# Patient Record
Sex: Male | Born: 1985 | Race: White | Hispanic: No | Marital: Single | Smoking: Current every day smoker
Health system: Southern US, Community
[De-identification: ages and names within clinical notes are randomized; demographics above are authoritative.]

## PROBLEM LIST (undated history)

## (undated) DIAGNOSIS — F319 Bipolar disorder, unspecified: Secondary | ICD-10-CM

---

## 2006-08-09 ENCOUNTER — Emergency Department (HOSPITAL_COMMUNITY): Admission: EM | Admit: 2006-08-09 | Discharge: 2006-08-09 | Payer: Self-pay | Admitting: Emergency Medicine

## 2013-01-01 ENCOUNTER — Encounter (HOSPITAL_COMMUNITY): Payer: Self-pay

## 2013-01-01 ENCOUNTER — Emergency Department (INDEPENDENT_AMBULATORY_CARE_PROVIDER_SITE_OTHER)
Admission: EM | Admit: 2013-01-01 | Discharge: 2013-01-01 | Disposition: A | Discharge status: Home / Self Care | Payer: Self-pay | Source: Home / Self Care

## 2013-01-01 DIAGNOSIS — Z23 Encounter for immunization: Secondary | ICD-10-CM

## 2013-01-01 DIAGNOSIS — S61209A Unspecified open wound of unspecified finger without damage to nail, initial encounter: Secondary | ICD-10-CM

## 2013-01-01 DIAGNOSIS — S61218A Laceration without foreign body of other finger without damage to nail, initial encounter: Secondary | ICD-10-CM

## 2013-01-01 MED ORDER — TETANUS-DIPHTH-ACELL PERTUSSIS 5-2.5-18.5 LF-MCG/0.5 IM SUSP
0.5000 mL | Freq: Once | INTRAMUSCULAR | Status: AC
  Administered 2013-01-01: 0.5 mL via INTRAMUSCULAR

## 2013-01-01 MED ORDER — BACITRACIN 500 UNIT/GM EX OINT
1.0000 "application " | TOPICAL_OINTMENT | Freq: Once | CUTANEOUS | Status: AC
  Administered 2013-01-01: 1 via TOPICAL

## 2013-01-01 MED ORDER — TETANUS-DIPHTH-ACELL PERTUSSIS 5-2.5-18.5 LF-MCG/0.5 IM SUSP
INTRAMUSCULAR | Status: AC
  Filled 2013-01-01: qty 0.5

## 2013-01-01 NOTE — ED Notes (Signed)
Patient instructed to return in 10 days for suture removal , sooner if problems

## 2013-01-01 NOTE — ED Provider Notes (Signed)
CSN: [id number]     Arrival date & time 01/01/13  1214 History   First MD Initiated Contact with Patient 01/01/13 1248     Chief Complaint  Patient presents with  . Finger Injury   (Consider location/radiation/quality/duration/timing/severity/associated sxs/prior Treatment) HPI Comments: Patient was repairing outdoor equipment, air conditioner and as he was removing his finger it brushed against a sharp corner of a metal edge and produced a laceration to the extensor surface of the right index finger.  Bleeding is minimal. Flexion and extension against resistance is intact.   History reviewed. No pertinent past medical history. History reviewed. No pertinent past surgical history. History reviewed. No pertinent family history. History  Substance Use Topics  . Smoking status: Current Every Day Smoker  . Smokeless tobacco: Not on file  . Alcohol Use: No    Review of Systems  Constitutional: Negative.   HENT: Negative.   Respiratory: Negative.   Gastrointestinal: Negative.   Musculoskeletal: Negative for arthralgias.  Skin: Positive for wound.  Neurological: Negative.     Allergies  Review of patient's allergies indicates not on file.  Home Medications  No current outpatient prescriptions on file. BP 98/63  Pulse 86  Temp(Src) 98.2 F (36.8 C) (Oral)  Resp 10  SpO2 100% Physical Exam  Nursing note and vitals reviewed. Constitutional: He is oriented to person, place, and time. He appears well-developed and well-nourished.  Pulmonary/Chest: Effort normal. No respiratory distress.  Musculoskeletal:  Right  index finger with intact flexion and extension against resistance. Distal N/V and M/S intact.  Neurological: He is alert and oriented to person, place, and time. He exhibits normal muscle tone.  Skin: Skin is warm and dry.  4.5 centimeter laceration is superficial running the longitudinal axis over the PIP to the DIP joint. There is a superficial fascial layer with a  .75 cm separation just beneath the subcutaneous layer. No evidence of involvement to the joint capsule. No tendon or bony exposure.  Psychiatric: He has a normal mood and affect.    ED Course  LACERATION REPAIR Date/Time: 01/01/2013 2:00 PM Performed by: Phineas Real, Dawayne Ohair Authorized by: Bradd Canary D Consent: Verbal consent obtained. Risks and benefits: risks, benefits and alternatives were discussed Consent given by: patient Patient understanding: patient states understanding of the procedure being performed Patient identity confirmed: verbally with patient Body area: upper extremity Location details: right index finger Laceration length: 4.5 cm Tendon involvement: none Nerve involvement: none Vascular damage: no Anesthesia: nerve block Local anesthetic: bupivacaine 0.5% without epinephrine Anesthetic total: 8 ml Patient sedated: no Preparation: Patient was prepped and draped in the usual sterile fashion. Irrigation solution: saline Irrigation method: jet lavage Amount of cleaning: extensive Debridement: none Degree of undermining: none Skin closure: 4-0 Prolene Subcutaneous closure: 4-0 Chromic gut Number of sutures: 9 Technique: simple Approximation: close Approximation difficulty: complex Dressing: antibiotic ointment and 4x4 sterile gauze Comments: The wound was explored, no tendon, joint, nerve, vessel involvement. Scrubbed with 4 x 4 and copiously irrigated.  Post closure the finger function was reassessed and is normal, no limitations or weakness. N/V, M/S remains intact.   (including critical care time) Labs Review Labs Reviewed - No data to display Imaging Review No results found.  MDM   1. Laceration of index finger, initial encounter      Keep the finger wrapped for the next 24 hours then change the dressing daily for 2-3 days. May use Neosporin ointment for the first 2-3 days, after that do not use additional ointment.  Keep clean and dry. Watch for signs of  infection. If any signs of infection return immediately. Otherwise return in 10 days for suture removal.  Hayden Rasmussen, NP 01/01/13 1438  Hayden Rasmussen, NP 01/01/13 1739

## 2013-01-01 NOTE — ED Notes (Signed)
Injury to right index finger w sharp piece of metal earlier today . Aprox 4 cm laceration observed, minimal bleeding . Pt states he could see the bone at time of injury; denies other injury

## 2013-01-02 NOTE — ED Provider Notes (Signed)
Medical screening examination/treatment/procedure(s) were performed by resident physician or non-physician practitioner and as supervising physician I was immediately available for consultation/collaboration.   Johana Hopkinson DOUGLAS MD.   Karrina Lye D Arleta Ostrum, MD 01/02/13 1550 

## 2015-04-30 ENCOUNTER — Encounter (HOSPITAL_COMMUNITY): Payer: Self-pay

## 2015-04-30 ENCOUNTER — Emergency Department (HOSPITAL_COMMUNITY)
Admission: EM | Admit: 2015-04-30 | Discharge: 2015-04-30 | Disposition: A | Discharge status: Home / Self Care | Payer: Self-pay | Attending: Emergency Medicine | Admitting: Emergency Medicine

## 2015-04-30 DIAGNOSIS — H538 Other visual disturbances: Secondary | ICD-10-CM | POA: Insufficient documentation

## 2015-04-30 DIAGNOSIS — Z77098 Contact with and (suspected) exposure to other hazardous, chiefly nonmedicinal, chemicals: Secondary | ICD-10-CM | POA: Insufficient documentation

## 2015-04-30 DIAGNOSIS — H5713 Ocular pain, bilateral: Secondary | ICD-10-CM | POA: Insufficient documentation

## 2015-04-30 DIAGNOSIS — H53149 Visual discomfort, unspecified: Secondary | ICD-10-CM | POA: Insufficient documentation

## 2015-04-30 DIAGNOSIS — F17200 Nicotine dependence, unspecified, uncomplicated: Secondary | ICD-10-CM | POA: Insufficient documentation

## 2015-04-30 MED ORDER — TETRACAINE HCL 0.5 % OP SOLN
2.0000 [drp] | Freq: Once | OPHTHALMIC | Status: AC
  Administered 2015-04-30: 2 [drp] via OPHTHALMIC
  Filled 2015-04-30: qty 2

## 2015-04-30 MED ORDER — FLUORESCEIN SODIUM 1 MG OP STRP
1.0000 | ORAL_STRIP | Freq: Once | OPHTHALMIC | Status: AC
  Administered 2015-04-30: 1 via OPHTHALMIC
  Filled 2015-04-30: qty 1

## 2015-04-30 MED ORDER — LORAZEPAM 1 MG PO TABS
1.0000 mg | ORAL_TABLET | Freq: Once | ORAL | Status: AC
  Administered 2015-04-30: 1 mg via ORAL
  Filled 2015-04-30: qty 1

## 2015-04-30 NOTE — ED Notes (Signed)
Morgans lens inserted to both eyes, 1L normal saline infusing to flush eyes

## 2015-04-30 NOTE — ED Notes (Signed)
PER EMS, Pt here with exposure to battery acid to bilateral eyes. EMS has been flushing eyes. Contact stuck in right eye

## 2015-04-30 NOTE — ED Notes (Signed)
Rolm Gala, PA at bedside at this time.

## 2015-04-30 NOTE — ED Notes (Signed)
PA checked the Ph of pts eyes and it indicated his eye's PH was WDL.

## 2015-04-30 NOTE — ED Notes (Signed)
Patient provided with blue paper scrubs to wear home d/t clothes wet from irrigation of eyes.

## 2015-04-30 NOTE — ED Provider Notes (Signed)
CSN: [id number]     Arrival date & time 04/30/15  1943 History   First MD Initiated Contact with Patient 04/30/15 1944     Chief Complaint  Patient presents with  . Chemical Exposure  . Eye Pain   HPI  Mr. Hutzler is a 30 year old male presenting with exposure to the eyes. 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 He states that he got battery acid into both of his eyes. He reports immediate onset of burning, pain and blurred vision. He got into his bathtub and rinsed his eyes with water. He then called EMS and has received 1 L flushing to the eyes prior to arrival. He believes he has a contact stuck in his right eye. He was able to remove the left contact. EMS attempted to locate the contact but did not visualize it. He reports improving symptoms after 1 L saline flush but his vision remains cloudy. He also endorses photophobia.   History reviewed. No pertinent past medical history. History reviewed. No pertinent past surgical history. No family history on file. Social History  Substance Use Topics  . Smoking status: Current Every Day Smoker  . Smokeless tobacco: None  . Alcohol Use: No    Review of Systems  Eyes: Positive for photophobia, pain, redness and visual disturbance. Negative for discharge and itching.  All other systems reviewed and are negative.     Allergies  Review of patient's allergies indicates not on file.  Home Medications   Prior to Admission medications   Not on File   BP 108/76 mmHg  Pulse 70  Temp(Src) 97.8 F (36.6 C) (Oral)  Resp 20  Ht 6' (1.829 m)  Wt 73.936 kg  BMI 22.10 kg/m2  SpO2 100% Physical Exam  Constitutional: He appears well-developed and well-nourished. No distress.  HENT:  Head: Normocephalic and atraumatic.  No burns to the skin surrounding the eyes.   Eyes: EOM and lids are normal. Pupils are equal, round, and reactive to light. Right eye exhibits no discharge. Left eye exhibits no discharge.  Right conjunctiva is injected. Left conjunctiva is injected. No scleral icterus.  Slit lamp exam:      The right eye shows no corneal abrasion, no corneal ulcer, no hyphema and no fluorescein uptake.       The left eye shows no corneal abrasion, no corneal ulcer, no hyphema and no fluorescein uptake.  Bilaterally injected sclera. Significant amount of tearing. Extraocular movements intact. Pupils are round and reactive to light. No hyphema. pH is 7 bilaterally. No corneal abrasion or ulcer on fluorescein exam.     Visual Acuity  Right Eye Near: R Near: 20/40 Left Eye Near:  L Near: 20/70 Bilateral Near:  20/40   Neck: Normal range of motion.  Cardiovascular: Normal rate and regular rhythm.   Pulmonary/Chest: Effort normal. No respiratory distress.  Musculoskeletal: Normal range of motion.  Neurological: He is alert. Coordination normal.  Skin: Skin is warm and dry.  Psychiatric: He has a normal mood and affect. His behavior is normal.  Nursing note and vitals reviewed.   ED Course  Procedures (including critical care time) Labs Review Labs Reviewed - No data to display  Imaging Review No results found. I have personally reviewed and evaluated these images and lab results as part of my medical decision-making.   EKG Interpretation None      8:00 - Spoke with Almira Coaster at [employer] who recommends continued irrigation and  fluorescein exam to evaluate for burns. No other treatment needed.   MDM   Final diagnoses:  Chemical exposure of eye   30 year old male presenting with chemical exposure to bilateral eyes. He reports working on a battery when the battery acid splashed into his eyes. VSS. Pt appears in distress. Upon presentation, pt is receiving eye irrigation by 1 L saline. Placed Morgans lens bilaterally and pt received additional liter of irrigation. Visual acuity 20/40 in right and 20/70 in left. PH 7 bilaterally. Consulted Dr. Genia Del with ophthalmology who recommends pt  follow up with him in the morning at his office. He recommends no further treatment. Return precautions given in discharge paperwork and discussed with pt at bedside. Pt stable for discharge     Alveta Heimlich, PA-C 04/30/15 2319  Rolan Bucco, MD 04/30/15 2330

## 2015-04-30 NOTE — ED Notes (Signed)
Dr. Belfi at bedside at this time. 

## 2015-04-30 NOTE — Discharge Instructions (Signed)
Call Dr. Benard Rink office tomorrow morning to schedule a follow up appointment to see him. Use artificial tears for comfort.

## 2016-05-27 ENCOUNTER — Emergency Department (HOSPITAL_COMMUNITY)
Admission: EM | Admit: 2016-05-27 | Discharge: 2016-05-28 | Disposition: A | Discharge status: Other Acute Inpatient Hospital | Payer: Federal, State, Local not specified - Other | Attending: Emergency Medicine | Admitting: Emergency Medicine

## 2016-05-27 ENCOUNTER — Emergency Department (HOSPITAL_COMMUNITY): Payer: Self-pay

## 2016-05-27 ENCOUNTER — Encounter (HOSPITAL_COMMUNITY): Payer: Self-pay

## 2016-05-27 DIAGNOSIS — R45851 Suicidal ideations: Secondary | ICD-10-CM

## 2016-05-27 DIAGNOSIS — F1994 Other psychoactive substance use, unspecified with psychoactive substance-induced mood disorder: Secondary | ICD-10-CM

## 2016-05-27 DIAGNOSIS — F0630 Mood disorder due to known physiological condition, unspecified: Secondary | ICD-10-CM

## 2016-05-27 DIAGNOSIS — S6990XA Unspecified injury of unspecified wrist, hand and finger(s), initial encounter: Secondary | ICD-10-CM

## 2016-05-27 DIAGNOSIS — Z5181 Encounter for therapeutic drug level monitoring: Secondary | ICD-10-CM | POA: Insufficient documentation

## 2016-05-27 DIAGNOSIS — F10920 Alcohol use, unspecified with intoxication, uncomplicated: Secondary | ICD-10-CM

## 2016-05-27 DIAGNOSIS — S61512A Laceration without foreign body of left wrist, initial encounter: Secondary | ICD-10-CM | POA: Insufficient documentation

## 2016-05-27 DIAGNOSIS — X789XXA Intentional self-harm by unspecified sharp object, initial encounter: Secondary | ICD-10-CM | POA: Insufficient documentation

## 2016-05-27 DIAGNOSIS — Y939 Activity, unspecified: Secondary | ICD-10-CM | POA: Insufficient documentation

## 2016-05-27 DIAGNOSIS — Y92009 Unspecified place in unspecified non-institutional (private) residence as the place of occurrence of the external cause: Secondary | ICD-10-CM | POA: Insufficient documentation

## 2016-05-27 DIAGNOSIS — Y999 Unspecified external cause status: Secondary | ICD-10-CM | POA: Insufficient documentation

## 2016-05-27 DIAGNOSIS — F17200 Nicotine dependence, unspecified, uncomplicated: Secondary | ICD-10-CM | POA: Insufficient documentation

## 2016-05-27 DIAGNOSIS — F10120 Alcohol abuse with intoxication, uncomplicated: Secondary | ICD-10-CM | POA: Insufficient documentation

## 2016-05-27 HISTORY — DX: Bipolar disorder, unspecified: F31.9

## 2016-05-27 LAB — COMPREHENSIVE METABOLIC PANEL
ALT: 21 U/L (ref 17–63)
AST: 40 U/L (ref 15–41)
Albumin: 4.5 g/dL (ref 3.5–5.0)
Alkaline Phosphatase: 66 U/L (ref 38–126)
Anion gap: 11 (ref 5–15)
BUN: 9 mg/dL (ref 6–20)
CO2: 22 mmol/L (ref 22–32)
Calcium: 9.1 mg/dL (ref 8.9–10.3)
Chloride: 109 mmol/L (ref 101–111)
Creatinine, Ser: 0.9 mg/dL (ref 0.61–1.24)
GFR calc Af Amer: >60 mL/min (ref >60)
Glucose, Bld: 84 mg/dL (ref 65–99)
POTASSIUM: 4 mmol/L (ref 3.5–5.1)
Sodium: 142 mmol/L (ref 135–145)
TOTAL PROTEIN: 7.5 g/dL (ref 6.5–8.1)
Total Bilirubin: 0.7 mg/dL (ref 0.3–1.2)

## 2016-05-27 LAB — RAPID URINE DRUG SCREEN, HOSP PERFORMED
AMPHETAMINES: POSITIVE — AB
BENZODIAZEPINES: NOT DETECTED
Barbiturates: NOT DETECTED
Cocaine: NOT DETECTED
OPIATES: POSITIVE — AB
Tetrahydrocannabinol: POSITIVE — AB

## 2016-05-27 LAB — CBC
HCT: 40 % (ref 39.0–52.0)
Hemoglobin: 13.6 g/dL (ref 13.0–17.0)
MCH: 27.9 pg (ref 26.0–34.0)
MCHC: 34 g/dL (ref 30.0–36.0)
MCV: 82.1 fL (ref 78.0–100.0)
Platelets: 327 10*3/uL (ref 150–400)
RBC: 4.87 MIL/uL (ref 4.22–5.81)
RDW: 13.8 % (ref 11.5–15.5)
WBC: 13.1 10*3/uL — AB (ref 4.0–10.5)

## 2016-05-27 LAB — ACETAMINOPHEN LEVEL: Acetaminophen (Tylenol), Serum: <10 ug/mL — ABNORMAL LOW (ref 10–30)

## 2016-05-27 LAB — SALICYLATE LEVEL

## 2016-05-27 LAB — ETHANOL: ALCOHOL ETHYL (B): 159 mg/dL — AB (ref <5)

## 2016-05-27 MED ORDER — NICOTINE 14 MG/24HR TD PT24: 14.0000 mg | MEDICATED_PATCH | Freq: Every day | TRANSDERMAL | Status: DC

## 2016-05-27 NOTE — ED Triage Notes (Signed)
PT arrives via EMS from home with lacerations to LEFT wrist. Bleeding controlled by gauze. Pt family on scene reported to ems that pt has been abusing cocaine, adderall, and alcohol x 4 days. Pt admits to use of heroine today to EMS. Pt reports to me that he has used "all drugs". Pt denied SI but states he does not know how the cuts on his left wrist got there. PT agitated during triage and stating hes ready to go. He does not know why he is here. PT refuses to change into burgundy scrubs.

## 2016-05-27 NOTE — ED Notes (Addendum)
Pt changed into paper scrubs. Pt wanded by security. Belongings placed in Pod A nurse's station but not inventoried at the moment. ED tech at bedside for sitting.

## 2016-05-27 NOTE — ED Notes (Signed)
Pt stating he wants to leave and refusing to put on scrubs. Attempted to call pt family to get more information with no answer. Dr. Julieanne Mansonegler in triage to see pt. IVC papers completed. Pt wound to left wrist cleaned and redressed with gauze and coban. Pt has 4 lacerations to left wrist that appear to be self inflicted. 3 superficial and 1 slightly deeper. Pt states they must be from punching glass door. No lacerations to hand.

## 2016-05-27 NOTE — ED Provider Notes (Signed)
[facility]-EMERGENCY DEPT Provider Note   CSN: [id number] Arrival date & time: 05/27/16  2059     History   Chief Complaint Chief Complaint  Patient presents with  . Suicidal    HPI Brad Saunders is a 31 y.o. male    The history is provided by the patient and the EMS personnel. The history is limited by the condition of the patient.  Mental Health Problem  Presenting symptoms: suicidal thoughts (by EMS report from family)   Presenting symptoms: no hallucinations and no homicidal ideas   Degree of incapacity (severity):  Moderate Onset quality:  Unable to specify Context: alcohol use and drug abuse   Associated symptoms: no abdominal pain and no chest pain   Risk factors: no hx of mental illness (per pt denial) and no hx of suicide attempts    Level V caveat for intoxication and patient not willing to answer questions.    History reviewed. No pertinent past medical history.  There are no active problems to display for this patient.   History reviewed. No pertinent surgical history.     Home Medications    Prior to Admission medications   Not on File    Family History No family history on file.  Social History Social History  Substance Use Topics  . Smoking status: Current Every Day Smoker  . Smokeless tobacco: Never Used  . Alcohol use No     Allergies   Patient has no known allergies.   Review of Systems Review of Systems  Unable to perform ROS: Psychiatric disorder  Cardiovascular: Negative for chest pain.  Gastrointestinal: Negative for abdominal pain.  Psychiatric/Behavioral: Positive for suicidal ideas (by EMS report from family). Negative for hallucinations and homicidal ideas.     Physical Exam Updated Vital Signs BP 111/73 (BP Location: Right Arm)   Pulse 70   Temp 98 F (36.7 C) (Oral)   Resp 22   SpO2 100%   Physical Exam  Constitutional: He is oriented to person, place, and time. He appears well-developed and well-nourished.  No distress.  HENT:  Head: Normocephalic and atraumatic.  Mouth/Throat: Oropharynx is clear and moist. No oropharyngeal exudate.  Eyes: Conjunctivae and EOM are normal. Pupils are equal, round, and reactive to light.  Neck: Normal range of motion. Neck supple.  Cardiovascular: Normal rate and intact distal pulses.   No murmur heard. Pulmonary/Chest: No stridor. No respiratory distress.  Abdominal: Soft. There is no tenderness. There is no rebound and no guarding.  Musculoskeletal: He exhibits no edema or tenderness.  Neurological: He is alert and oriented to person, place, and time. He displays normal reflexes. No cranial nerve deficit. He exhibits normal muscle tone. Coordination normal.  Skin: Skin is warm. Capillary refill takes less than 2 seconds. No rash noted. He is not diaphoretic. No erythema.  Psychiatric: He is agitated. He expresses no homicidal and no suicidal ideation. He expresses no suicidal plans and no homicidal plans.  Patient not answering questions for this examiner. Patient does appear agitated at times of discussion. Patient denying SI and HI but clearly tried to cut his wrist tonight and told his family he wanted to kill himself by EMS report.     ED Treatments / Results  Labs (all labs ordered are listed, but only abnormal results are displayed) Labs Reviewed  ETHANOL - Abnormal; Notable for the following:       Result Value   Alcohol, Ethyl (B) 159 (*)    All other components within normal  limits  ACETAMINOPHEN LEVEL - Abnormal; Notable for the following:    Acetaminophen (Tylenol), Serum <10 (*)    All other components within normal limits  CBC - Abnormal; Notable for the following:    WBC 13.1 (*)    All other components within normal limits  RAPID URINE DRUG SCREEN, HOSP PERFORMED - Abnormal; Notable for the following:    Opiates POSITIVE (*)    Amphetamines POSITIVE (*)    Tetrahydrocannabinol POSITIVE (*)    All other components within normal limits    COMPREHENSIVE METABOLIC PANEL  SALICYLATE LEVEL    EKG  EKG Interpretation  Date/Time:  Monday May 27 2016 22:22:06 EST Ventricular Rate:  74 PR Interval:    QRS Duration: 82 QT Interval:  403 QTC Calculation: 448 R Axis:   85 Text Interpretation:  Sinus rhythm Short PR interval Abnrm T, consider ischemia, anterolateral lds No old tracing to compare Confirmed by Rhunette CroftNANAVATI, MD, Janey GentaANKIT [phone]) on 05/28/2016 4:23:35 AM Also confirmed by Rhunette CroftNANAVATI, MD, Janey GentaANKIT [phone]([id number]), editor Stout CT, Jola BabinskiMarilyn [phone]([id number])  on 05/28/2016 7:56:35 AM       Radiology Dg Wrist Complete Left  Result Date: 05/27/2016 CLINICAL DATA:  Acute onset of lacerations to the left wrist, with bruising and abrasions about the hands. Initial encounter. EXAM: LEFT WRIST - COMPLETE 3+ VIEW COMPARISON:  None. FINDINGS: There is no evidence of fracture or dislocation. The carpal rows are intact, and demonstrate normal alignment. The joint spaces are preserved. Known soft tissue lacerations are not well characterized on radiograph. No radiopaque foreign bodies are seen. IMPRESSION: No evidence of fracture or dislocation. Electronically Signed   By: Roanna RaiderJeffery  Chang M.D.   On: 05/27/2016 22:41   Dg Hand Complete Left  Result Date: 05/27/2016 CLINICAL DATA:  Lacerations to the left wrist, acute onset, with bruising and abrasions about the hand. Initial encounter. EXAM: LEFT HAND - COMPLETE 3+ VIEW COMPARISON:  None. FINDINGS: There is no evidence of fracture or dislocation. The joint spaces are preserved. The carpal rows are intact, and demonstrate normal alignment. The soft tissues are unremarkable in appearance. The known soft tissue lacerations are not well characterized. No radiopaque foreign bodies are seen. IMPRESSION: 1. No evidence of fracture or dislocation. 2. No radiopaque foreign bodies seen. Electronically Signed   By: Roanna RaiderJeffery  Chang M.D.   On: 05/27/2016 22:40   Dg Hand Complete Right  Result Date: 05/27/2016 CLINICAL DATA:   Acute onset of right hand bruising and abrasions. Initial encounter. EXAM: RIGHT HAND - COMPLETE 3+ VIEW COMPARISON:  None. FINDINGS: There is no evidence of fracture or dislocation. There is mild chronic deformity of the fifth metacarpal. The joint spaces are preserved. The carpal rows are intact, and demonstrate normal alignment. Known soft tissue abrasions are not well characterized on radiograph. IMPRESSION: No evidence of fracture or dislocation. Electronically Signed   By: Roanna RaiderJeffery  Chang M.D.   On: 05/27/2016 22:42    Procedures Procedures (including critical care time)  Medications Ordered in ED Medications  nicotine (NICODERM CQ - dosed in mg/24 hours) patch 14 mg (14 mg Transdermal Refused 05/28/16 1005)     Initial Impression / Assessment and Plan / ED Course  I have reviewed the triage vital signs and the nursing notes.  Pertinent labs & imaging results that were available during my care of the patient were reviewed by me and considered in my medical decision making (see chart for details).     9:25 PM Nursing called to request examination of patient  in triage.  According to nursing description of EMS report, EMS was called by family for concerns for suicidal ideation and self-injurious behavior with lacerations to left wrist. They report the patient has been abusing alcohol, cocaine, heroin, and Adderall for the last 4 days. They really felt that the patient was a danger to himself thus calling EMS.   I quickly examined the patient and he denies SI or HI however, he does appear clinically intoxicated. Patient has lacerations to the left wrist that are hemostatic on my examination. Patient also has abrasions on both hands. Initially, patient reported that he does not know how he injured himself however, when the wounds were being explored, he reports they were from a glass door.   Due to the inconsistencies of his story and the reported family concern for suicidal ideation,  substance abuse, and the self injuries behavior, patient will be placed under involuntary commitment to allow further workup and management. Feel patient is a danger to himself at this time. Emergency contact in chart was the patient's mother and did not answer the phone.    Patient was brought back to his exam room after involuntary commitment. Patient continues to deny SI and HI and will not answer questions. He says he does not know what happened to him tonight. He does report drug use. And alcohol use.  Patient will have x-rays of his hands to look for retained foreign bodies and fractures.   Patient will have screening laboratory testing and will subsequently have psychiatry consultation with TTS.  11:18 PM patient was again assessed and will not answer questions. Patient is staring at the floor and will not engage this examiner in meaningful conversation.  Await medical clearance following lab testing for TTS evaluation.  Patient does however say that he doesn't want stitches in his arm. Patient's arm was evaluated in his lacerations were superficial and hemostatic. Patient offered dressing with Steri-Strips however, patient wants to leave it bandaged. Patient reports that he is up-to-date on his tetanus vaccination.  Patient laboratory testing shows intoxication. Patient will metabolize and then pt will be seen by TTS for further management Recommendations.    Final Clinical Impressions(s) / ED Diagnoses   Final diagnoses:  Hand injury  Suicidal ideation  Alcoholic intoxication without complication (HCC)    Clinical Impression: 1. Suicidal ideation   2. Hand injury   3. Alcoholic intoxication without complication (HCC)     Disposition: Awaiting behavioral health assessment, patient currently under involuntary commitment  Condition: Stable     Heide Scales, MD 05/28/16 1120

## 2016-05-27 NOTE — ED Notes (Signed)
[employer] and security at bedside. Blood collected and EKG completed. Pt non compliant at first but stated "just do what you need to do so you can get out of here." Pt reporting 10/10 pain on left wrist. Pt bleeding through bandages. Redressed and cleaned.

## 2016-05-27 NOTE — ED Notes (Addendum)
Patient transported to X-ray with security.

## 2016-05-28 ENCOUNTER — Inpatient Hospital Stay (HOSPITAL_COMMUNITY)
Admission: AD | Admit: 2016-05-28 | Discharge: 2016-06-02 | DRG: 897 | Disposition: A | Discharge status: Home / Self Care | Payer: Federal, State, Local not specified - Other | Attending: Psychiatry | Admitting: Psychiatry

## 2016-05-28 ENCOUNTER — Encounter (HOSPITAL_COMMUNITY): Payer: Self-pay | Admitting: *Deleted

## 2016-05-28 DIAGNOSIS — F0630 Mood disorder due to known physiological condition, unspecified: Secondary | ICD-10-CM | POA: Diagnosis present

## 2016-05-28 DIAGNOSIS — G4700 Insomnia, unspecified: Secondary | ICD-10-CM | POA: Diagnosis present

## 2016-05-28 DIAGNOSIS — F11222 Opioid dependence with intoxication with perceptual disturbance: Secondary | ICD-10-CM | POA: Diagnosis not present

## 2016-05-28 DIAGNOSIS — F1994 Other psychoactive substance use, unspecified with psychoactive substance-induced mood disorder: Secondary | ICD-10-CM

## 2016-05-28 DIAGNOSIS — F15129 Other stimulant abuse with intoxication, unspecified: Secondary | ICD-10-CM | POA: Diagnosis present

## 2016-05-28 DIAGNOSIS — F319 Bipolar disorder, unspecified: Secondary | ICD-10-CM | POA: Diagnosis present

## 2016-05-28 DIAGNOSIS — F17210 Nicotine dependence, cigarettes, uncomplicated: Secondary | ICD-10-CM | POA: Diagnosis present

## 2016-05-28 DIAGNOSIS — F15150 Other stimulant abuse with stimulant-induced psychotic disorder with delusions: Secondary | ICD-10-CM | POA: Diagnosis present

## 2016-05-28 DIAGNOSIS — Z915 Personal history of self-harm: Secondary | ICD-10-CM | POA: Diagnosis not present

## 2016-05-28 DIAGNOSIS — F19950 Other psychoactive substance use, unspecified with psychoactive substance-induced psychotic disorder with delusions: Secondary | ICD-10-CM | POA: Diagnosis not present

## 2016-05-28 DIAGNOSIS — F411 Generalized anxiety disorder: Secondary | ICD-10-CM | POA: Diagnosis present

## 2016-05-28 DIAGNOSIS — F1094 Alcohol use, unspecified with alcohol-induced mood disorder: Secondary | ICD-10-CM | POA: Diagnosis not present

## 2016-05-28 DIAGNOSIS — Z79899 Other long term (current) drug therapy: Secondary | ICD-10-CM | POA: Diagnosis not present

## 2016-05-28 MED ORDER — NICOTINE 21 MG/24HR TD PT24
21.0000 mg | MEDICATED_PATCH | Freq: Every day | TRANSDERMAL | Status: DC
  Administered 2016-05-28: 21 mg via TRANSDERMAL
  Filled 2016-05-28: qty 1

## 2016-05-28 MED ORDER — ACETAMINOPHEN 325 MG PO TABS
650.0000 mg | ORAL_TABLET | ORAL | Status: DC | PRN
Start: 2016-05-28 — End: 2016-05-28

## 2016-05-28 MED ORDER — HYDROXYZINE HCL 25 MG PO TABS
25.0000 mg | ORAL_TABLET | Freq: Three times a day (TID) | ORAL | Status: DC | PRN
  Administered 2016-05-28 – 2016-06-02 (×4): 25 mg via ORAL
  Filled 2016-05-28: qty 10
  Filled 2016-05-28 (×4): qty 1

## 2016-05-28 MED ORDER — MAGNESIUM HYDROXIDE 400 MG/5ML PO SUSP: 30.0000 mL | Freq: Every day | ORAL | Status: DC | PRN

## 2016-05-28 MED ORDER — ONDANSETRON HCL 4 MG PO TABS: 4.0000 mg | ORAL_TABLET | Freq: Three times a day (TID) | ORAL | Status: DC | PRN

## 2016-05-28 MED ORDER — ACETAMINOPHEN 325 MG PO TABS
650.0000 mg | ORAL_TABLET | Freq: Four times a day (QID) | ORAL | Status: DC | PRN
  Administered 2016-05-29: 650 mg via ORAL
  Filled 2016-05-28: qty 2

## 2016-05-28 MED ORDER — ZOLPIDEM TARTRATE 5 MG PO TABS: 5.0000 mg | ORAL_TABLET | Freq: Every evening | ORAL | Status: DC | PRN

## 2016-05-28 MED ORDER — ALUM & MAG HYDROXIDE-SIMETH 200-200-20 MG/5ML PO SUSP: 30.0000 mL | ORAL | Status: DC | PRN

## 2016-05-28 MED ORDER — TRAZODONE HCL 50 MG PO TABS
50.0000 mg | ORAL_TABLET | Freq: Every evening | ORAL | Status: DC | PRN
  Administered 2016-05-28: 50 mg via ORAL
  Filled 2016-05-28: qty 1

## 2016-05-28 MED ORDER — IBUPROFEN 400 MG PO TABS: 600.0000 mg | ORAL_TABLET | Freq: Three times a day (TID) | ORAL | Status: DC | PRN

## 2016-05-28 MED ORDER — LORAZEPAM 1 MG PO TABS
1.0000 mg | ORAL_TABLET | Freq: Three times a day (TID) | ORAL | Status: DC | PRN
  Administered 2016-05-28: 1 mg via ORAL
  Filled 2016-05-28: qty 1

## 2016-05-28 NOTE — ED Notes (Signed)
Spouse visiting w/pt.  

## 2016-05-28 NOTE — Consult Note (Signed)
[name]   Reason for Consult:  SIB, destruction of property and polysubstance abuse vs intoxication Referring Physician:  EDP Patient Identification: Brad Saunders MRN:  [id number] Principal Diagnosis: Psychoactive substance-induced mood disorder ([place]) Diagnosis:  There are no active problems to display for this patient.   Total Time spent with patient: 1 hour  Subjective:   Brad Saunders is a 31 y.o. male patient admitted with polysubstance intoxication and SIB.  HPI:  Brad Saunders is an 31 y.o. male, Caucasian, seen, chart reviewed and case discussed with the staff RN on the unit for this face-to-face psychiatric consultation and evaluation of increased symptoms of depression, relapse and drug abuse, intoxication with alcohol, opiates, amphetamines and tetrahydrocannabinol. Patient reported he has been depressed and unhappy about not having a good relationship with his wife and blames her or the drug of abuse. Patient reported he was relapse of her longtime of sober, took 4 piece of amphetamines, 4 bottles of wine and 6 about a beer for a whole day long before came to home and then he broke the window which resulted in multiple lacerations on his right forearm and then horizontal multiple lacerations on left forearm required dressing with gauze. Patient family reported he has been abusing cocaine, Adderall, alcohol for the last 4 days which is minimized by the patient. Reportedly patient was also abused heroin which patient denied during my evaluation. Patient endorses is self-injurious behavior, property damage and putting himself in danger. Patient reportedly has 4 children ages from one year to 68 years old and reportedly never been involved with Department of Social Services. Patient also denied suicidal attempts in the past. Patient also denied history of detox treatment and rehabilitation treatment in the past. Patient is currently placed on involuntary  commitment and he meets criteria for acute psychiatric hospitalization for both depression and substance abuse. Patient was cooperative throughout the assessment and states that he is agreeable to inpatient psychiatric treatment.  A blood alcohol level is 159 on admission to the: Emergency department and his urine drug screen is positive for amphetamines, tetrahydrocannabinol, and opiates.  Past Psychiatric History: Substance Induced Depressive Disorder; Alcohol Use Disorder, Severe  Risk to Self: Suicidal Ideation: No Suicidal Intent: No Is patient at risk for suicide?: No Suicidal Plan?: No Access to Means: No What has been your use of drugs/alcohol within the last 12 months?: amphetmaines, opiates, alcohol How many times?: 0 Other Self Harm Risks: none Triggers for Past Attempts: Unknown Intentional Self Injurious Behavior: None Risk to Others: Homicidal Ideation: No Thoughts of Harm to Others: No Current Homicidal Intent: No Current Homicidal Plan: No Access to Homicidal Means: No Identified Victim: none History of harm to others?: No Assessment of Violence: None Noted Violent Behavior Description: n/a Does patient have access to weapons?: No Criminal Charges Pending?: No Does patient have a court date: No Prior Inpatient Therapy: Prior Inpatient Therapy: No Prior Therapy Dates: n/a Prior Therapy Facilty/Provider(s): n/a Reason for Treatment: n/a Prior Outpatient Therapy: Prior Outpatient Therapy: No Prior Therapy Dates: n/a Prior Therapy Facilty/Provider(s): n/a Reason for Treatment: n/a Does patient have an ACCT team?: No Does patient have Intensive In-House Services?  : No Does patient have [facility] services? : No Does patient have P4CC services?: No  Past Medical History: History reviewed. No pertinent past medical history. History reviewed. No pertinent surgical history. Family History: No family history on file. Family Psychiatric  History: Patient has been married  and lives with wife and 4 children. Reportedly patient  wife is also been involved with the drug of abuse especially opiates. Social History:  History  Alcohol Use No     History  Drug use: Unknown    Social History   Social History  . Marital status: Single    Spouse name: N/A  . Number of children: N/A  . Years of education: N/A   Social History Main Topics  . Smoking status: Current Every Day Smoker  . Smokeless tobacco: Never Used  . Alcohol use No  . Drug use: Unknown  . Sexual activity: Not Asked   Other Topics Concern  . None   Social History Narrative  . None   Additional Social History:    Allergies:  No Known Allergies  Labs:  Results for orders placed or performed during the hospital encounter of 05/27/16 (from the past 48 hour(s))  Comprehensive metabolic panel     Status: None   Collection Time: 05/27/16 10:10 PM  Result Value Ref Range   Sodium 142 135 - 145 mmol/L   Potassium 4.0 3.5 - 5.1 mmol/L   Chloride 109 101 - 111 mmol/L   CO2 22 22 - 32 mmol/L   Glucose, Bld 84 65 - 99 mg/dL   BUN 9 6 - 20 mg/dL   Creatinine, Ser 0.90 0.61 - 1.24 mg/dL   Calcium 9.1 8.9 - 10.3 mg/dL   Total Protein 7.5 6.5 - 8.1 g/dL   Albumin 4.5 3.5 - 5.0 g/dL   AST 40 15 - 41 U/L   ALT 21 17 - 63 U/L   Alkaline Phosphatase 66 38 - 126 U/L   Total Bilirubin 0.7 0.3 - 1.2 mg/dL   GFR calc non Af Amer >60 >60 mL/min   GFR calc Af Amer >60 >60 mL/min    Comment: (NOTE) The eGFR has been calculated using the CKD EPI equation. This calculation has not been validated in all clinical situations. eGFR's persistently <60 mL/min signify possible Chronic Kidney Disease.    Anion gap 11 5 - 15  Ethanol     Status: Abnormal   Collection Time: 05/27/16 10:10 PM  Result Value Ref Range   Alcohol, Ethyl (B) 159 (H) <5 mg/dL    Comment:        LOWEST DETECTABLE LIMIT FOR SERUM ALCOHOL IS 5 mg/dL FOR MEDICAL PURPOSES ONLY   Salicylate level     Status: None   Collection  Time: 05/27/16 10:10 PM  Result Value Ref Range   Salicylate Lvl <1.3 2.8 - 30.0 mg/dL  Acetaminophen level     Status: Abnormal   Collection Time: 05/27/16 10:10 PM  Result Value Ref Range   Acetaminophen (Tylenol), Serum <10 (L) 10 - 30 ug/mL    Comment:        THERAPEUTIC CONCENTRATIONS VARY SIGNIFICANTLY. A RANGE OF 10-30 ug/mL MAY BE AN EFFECTIVE CONCENTRATION FOR MANY PATIENTS. HOWEVER, SOME ARE BEST TREATED AT CONCENTRATIONS OUTSIDE THIS RANGE. ACETAMINOPHEN CONCENTRATIONS >150 ug/mL AT 4 HOURS AFTER INGESTION AND >50 ug/mL AT 12 HOURS AFTER INGESTION ARE OFTEN ASSOCIATED WITH TOXIC REACTIONS.   cbc     Status: Abnormal   Collection Time: 05/27/16 10:10 PM  Result Value Ref Range   WBC 13.1 (H) 4.0 - 10.5 K/uL   RBC 4.87 4.22 - 5.81 MIL/uL   Hemoglobin 13.6 13.0 - 17.0 g/dL   HCT 40.0 39.0 - 52.0 %   MCV 82.1 78.0 - 100.0 fL   MCH 27.9 26.0 - 34.0 pg   MCHC 34.0 30.0 -  36.0 g/dL   RDW 13.8 11.5 - 15.5 %   Platelets 327 150 - 400 K/uL  Rapid urine drug screen (hospital performed)     Status: Abnormal   Collection Time: 05/27/16 11:21 PM  Result Value Ref Range   Opiates POSITIVE (A) NONE DETECTED   Cocaine NONE DETECTED NONE DETECTED   Benzodiazepines NONE DETECTED NONE DETECTED   Amphetamines POSITIVE (A) NONE DETECTED   Tetrahydrocannabinol POSITIVE (A) NONE DETECTED   Barbiturates NONE DETECTED NONE DETECTED    Comment:        DRUG SCREEN FOR MEDICAL PURPOSES ONLY.  IF CONFIRMATION IS NEEDED FOR ANY PURPOSE, NOTIFY LAB WITHIN 5 DAYS.        LOWEST DETECTABLE LIMITS FOR URINE DRUG SCREEN Drug Class       Cutoff (ng/mL) Amphetamine      1000 Barbiturate      200 Benzodiazepine   867 Tricyclics       619 Opiates          300 Cocaine          300 THC              50     Current Facility-Administered Medications  Medication Dose Route Frequency Provider Last Rate Last Dose  . nicotine (NICODERM CQ - dosed in mg/24 hours) patch 14 mg  14 mg  Transdermal Daily Courtney Paris, MD       No current outpatient prescriptions on file.    Musculoskeletal: Strength & Muscle Tone: within normal limits Gait & Station: normal Patient leans: N/A  Psychiatric Specialty Exam: Physical Exam Full physical performed in Emergency Department. I have reviewed this assessment and concur with its findings.   ROS patient has multiple lacerations on left forearm which are horizontal required dressing to prevent blood loss and also has multiple superficial lacerations/injuries in right hand. Patient denied nausea, vomiting, sweating, tremors, shakes, withdrawal seizures and hallucinations, denied chest pain and shortness of breath.  No Fever-chills, No Headache, No changes with Vision or hearing, reports vertigo No problems swallowing food or Liquids, No Chest pain, Cough or Shortness of Breath, No Abdominal pain, No Nausea or Vommitting, Bowel movements are regular, No Blood in stool or Urine, No dysuria, No new skin rashes or bruises, No new joints pains-aches,  No new weakness, tingling, numbness in any extremity, No recent weight gain or loss, No polyuria, polydypsia or polyphagia,  A full 10 point Review of Systems was done, except as stated above, all other Review of Systems were negative.  Blood pressure (!) 110/52, pulse 65, temperature 98.2 F (36.8 C), temperature source Oral, resp. rate 18, SpO2 100 %.There is no height or weight on file to calculate BMI.  General Appearance: Guarded  Eye Contact:  Good  Speech:  Clear and Coherent  Volume:  Decreased  Mood:  Anxious and Depressed  Affect:  Congruent, Constricted and Depressed  Thought Process:  Coherent and Goal Directed  Orientation:  Full (Time, Place, and Person)  Thought Content:  Rumination  Suicidal Thoughts:  Status post self-injurious behaviors while intoxicated.  Homicidal Thoughts:  No  Memory:  Immediate;   Good Recent;   Fair  Judgement:  Impaired   Insight:  Fair  Psychomotor Activity:  Decreased  Concentration:  Concentration: Fair and Attention Span: Fair  Recall:  [employer] of Knowledge:  Good  Language:  Good  Akathisia:  Negative  Handed:  Right  AIMS (if indicated):  Assets:  Communication Skills Desire for Improvement Financial Resources/Insurance Housing Leisure Time Physical Health Resilience Social Support Talents/Skills Transportation  ADL's:  Intact  Cognition:  WNL  Sleep:        Treatment Plan Summary: 31 years old male with history of polysubstance abuse, recent relapse which caused an dangerous and disruptive behaviors like damaging the property and self-injurious behaviors while intoxicated. Patient cannot contract for safety during this evaluation and willing to participate in inpatient treatment program.   Diagnosis:  Psychoactive substance induced mood disorder  Polysubstance abuse versus dependence  Alcohol intoxication and polysubstance intoxication  Marital discord/relationship problems   Patient has been placed on involuntary commitment by emergency department physicians Continue safety sitter  Monitor for the alcohol withdrawal symptoms CIWA protocol and lorazepam detox protocol  Patient meets criteria for acute psychiatric hospitalization when medically stable.   Appreciate psychiatric consultation and we sign off as of today as patient required inpatient treatment Please contact [phone] or [phone] if needs further assistance   Disposition: Recommend psychiatric Inpatient admission when medically cleared. Supportive therapy provided about ongoing stressors.  Ambrose Finland, MD 05/28/2016 11:08 AM

## 2016-05-28 NOTE — ED Notes (Signed)
Dr Oneta RackJonnalaggadda in w/pt.

## 2016-05-28 NOTE — ED Notes (Signed)
Per Dr Oneta RackJonnalaggadda, recommends to keep IVC d/t pt needs inpt tx.

## 2016-05-28 NOTE — ED Notes (Signed)
Patient was given a [place] Sandwich Bag with a Sprite.

## 2016-05-28 NOTE — Tx Team (Signed)
Initial Treatment Plan 05/28/2016 11:33 PM Brad Saunders Bandel [id number]    PATIENT STRESSORS: Financial difficulties Medication change or noncompliance Substance abuse   PATIENT STRENGTHS: Active sense of humor Average or above average intelligence Communication skills General fund of knowledge Supportive family/friends   PATIENT IDENTIFIED PROBLEMS: "felt like I didn't matter anymore"  "the way I been acting, like I'm crazy"   "been using pain pills (roxicet)"  "drank bottle of wine last night"  SA cut left wrist             DISCHARGE CRITERIA:  Ability to meet basic life and health needs Improved stabilization in mood, thinking, and/or behavior Motivation to continue treatment in a less acute level of care Need for constant or close observation no longer present Reduction of life-threatening or endangering symptoms to within safe limits Verbal commitment to aftercare and medication compliance  PRELIMINARY DISCHARGE PLAN: Attend aftercare/continuing care group Attend PHP/IOP Participate in family therapy Return to previous living arrangement  PATIENT/FAMILY INVOLVEMENT: This treatment plan has been presented to and reviewed with the patient, Brad Saunders Callicott, and/or family member.  The patient and family have been given the opportunity to ask questions and make suggestions.  Mickeal NeedyJohnson, Ott Zimmerle N, RN 05/28/2016, 11:33 PM

## 2016-05-28 NOTE — Progress Notes (Signed)
Per Karleen HampshireSpencer, PA recommend a.m. Psych evaluation Angelino Rumery K. Sherlon HandingHarris, LCAS-A, LPC-A, [facility]  Counselor 05/28/2016 2:13 AM

## 2016-05-28 NOTE — ED Notes (Signed)
Patient transported by sheriff's office to [facility].  No distress at this time

## 2016-05-28 NOTE — ED Notes (Signed)
[facility]  [facility] office contacted to transport patient.

## 2016-05-28 NOTE — ED Notes (Addendum)
TTS at bedside. 

## 2016-05-28 NOTE — ED Notes (Signed)
[facility] Lindie SpruceMeghan, [facility] Dr Henrene HawkingJonnalaggada to see pt.

## 2016-05-28 NOTE — ED Notes (Addendum)
Pt ambulatory to F7 w/Sitter and RN - pt wearing burgundy scrubs and has bandage on his left wrist - dry, intact. Pt able to wiggle fingers, move wrist, and cap refill less than 3 seconds. Offered pt lunch tray, po fluids, Nicotine patch, Ativan - pt refused. Pt noted to be irritable - states does not want to be here in ED. Advised pt of tx plan - will be in ED until placement is sought if plan is not changed w/telepsych is performed. Pt noted w/min eye contact. Pt pacing in room and attempting to stand in hallway - looking at Exit doors. Asked pt to stay in room. Pt asking repetitive questions.

## 2016-05-28 NOTE — ED Notes (Signed)
[facility], copy sent to [facility], original placed in folder for Magistrate, and all 3 sets on clipboard. IVC papers were taken out by pt's spouse and EDP.  Pt lying on bed watching tv.

## 2016-05-28 NOTE — ED Notes (Signed)
[facility] Julieanne Cottonina, [facility], [facility] Dr Jama Flavorsobos - call report and transport after 1930 by [employer].

## 2016-05-28 NOTE — BH Assessment (Signed)
Tele Assessment Note   Brad Saunders is an 31 y.o. male, Caucasian who presents to [facility] ED per ED report:  arrives via EMS from home with lacerations to LEFT wrist. Bleeding controlled by gauze. Pt family on scene reported to ems that pt has been abusing cocaine, adderall, and alcohol x 4 days. Pt admits to use of heroine today to EMS. Pt reports to me that he has used "all drugs". Pt denied SI but states he does not know how the cuts on his left wrist got there. PT agitated during triage and stating hes ready to go. He does not know why he is here.  Patient states primary concern is none. Patient states that he drank last night and does not remember much else, and does not know why he is here. Patient states he is married and resides with wife. Patient denies current SI,HI, and AVH. Patient acknowledges hx. Of S.A with alcohol last use 05-27-16 unknown amount, and marijuana, unspecified,and denies others despite Urine drug Screen positive for amphetamines and Opiates as well. Patient denies hx. Of inpatient psych care. Patient is not seen outpatient for psych or S.A. Care. Patient is dressed in scrubs and is alert and oriented x4. Patient speech was within normal limits and motor behavior appeared normal. Patient thought process is coherent. Patient  does not appear to be responding to internal stimuli. Patient was cooperative throughout the assessment and states that he is agreeable to inpatient psychiatric treatment.   Diagnosis: Substance Induced Depressive Disorder; Alcohol Use Disorder, Severe  Past Medical History: History reviewed. No pertinent past medical history.  History reviewed. No pertinent surgical history.  Family History: No family history on file.  Social History:  reports that he has been smoking.  He has never used smokeless tobacco. He reports that he does not drink alcohol. His drug history is not on file.  Additional Social History:  Alcohol / Drug Use Pain  Medications: SEE MAR Prescriptions: SEE MAR Over the Counter: SEE MAR History of alcohol / drug use?: Yes Longest period of sobriety (when/how long): pt does not know Negative Consequences of Use: Financial, Legal, Personal relationships, Work / School Withdrawal Symptoms: Patient aware of relationship between substance abuse and physical/medical complications Substance #1 Name of Substance 1: alcohol 1 - Age of First Use: unspecified 1 - Amount (size/oz): various 1 - Frequency: daily 1 - Duration: years 1 - Last Use / Amount: 05-27-16 unknown amount Substance #2 Name of Substance 2: Opioids/ heroine 2 - Age of First Use: unspecified 2 - Amount (size/oz): unspecified 2 - Frequency: unspecified 2 - Duration: years 2 - Last Use / Amount: unspecified Substance #3 Name of Substance 3: amphetamines 3 - Age of First Use: unspecified 3 - Amount (size/oz): unspecified 3 - Frequency: unspecified 3 - Duration: years 3 - Last Use / Amount: unspecified  CIWA: CIWA-Ar BP: 106/68 Pulse Rate: 66 COWS:    PATIENT STRENGTHS: (choose at least two) Capable of independent living Communication skills General fund of knowledge  Allergies: No Known Allergies  Home Medications:  (Not in a hospital admission)  OB/GYN Status:  No LMP for male patient.  General Assessment Data Location of Assessment: [facility] ED TTS Assessment: In system Is this a Tele or Face-to-Face Assessment?: Tele Assessment Is this an Initial Assessment or a Re-assessment for this encounter?: Initial Assessment Marital status: Married [place] name: n/a Is patient pregnant?: No Pregnancy Status: No Living Arrangements: Spouse/significant other Can pt return to current living arrangement?: Yes  Admission Status: Involuntary Is patient capable of signing voluntary admission?: Yes Referral Source: Other Insurance type: SP     Crisis Care Plan Living Arrangements: Spouse/significant other Name of Psychiatrist:  none Name of Therapist: none  Education Status Is patient currently in school?: No Current Grade: n/a Highest grade of school patient has completed: 9th Name of school: n/a Contact person: n/a  Risk to self with the past 6 months Suicidal Ideation: No Has patient been a risk to self within the past 6 months prior to admission? : No Suicidal Intent: No Has patient had any suicidal intent within the past 6 months prior to admission? : No Is patient at risk for suicide?: No Suicidal Plan?: No Has patient had any suicidal plan within the past 6 months prior to admission? : No Access to Means: No What has been your use of drugs/alcohol within the last 12 months?: amphetmaines, opiates, alcohol Previous Attempts/Gestures: No How many times?: 0 Other Self Harm Risks: none Triggers for Past Attempts: Unknown Intentional Self Injurious Behavior: None Family Suicide History: No Recent stressful life event(s): Turmoil (Comment) Persecutory voices/beliefs?: No Depression: No Substance abuse history and/or treatment for substance abuse?: Yes Suicide prevention information given to non-admitted patients: Not applicable  Risk to Others within the past 6 months Homicidal Ideation: No Does patient have any lifetime risk of violence toward others beyond the six months prior to admission? : No Thoughts of Harm to Others: No Current Homicidal Intent: No Current Homicidal Plan: No Access to Homicidal Means: No Identified Victim: none History of harm to others?: No Assessment of Violence: None Noted Violent Behavior Description: n/a Does patient have access to weapons?: No Criminal Charges Pending?: No Does patient have a court date: No Is patient on probation?: No  Psychosis Hallucinations: None noted Delusions: None noted  Mental Status Report Appearance/Hygiene: In scrubs Eye Contact: Fair Motor Activity: Agitation Speech: Logical/coherent Level of Consciousness: Alert Mood:  Anxious Affect: Anxious Anxiety Level: Moderate Thought Processes: Relevant Judgement: Partial Orientation: Person, Place, Time, Situation, Appropriate for developmental age Obsessive Compulsive Thoughts/Behaviors: None  Cognitive Functioning Concentration: Normal Memory: Recent Intact, Remote Intact IQ: Average Insight: Poor Impulse Control: Poor Appetite: Fair Weight Loss: 0 Weight Gain: 0 Sleep: No Change Total Hours of Sleep: 6 Vegetative Symptoms: None  ADLScreening [facility] Assessment Services) Patient's cognitive ability adequate to safely complete daily activities?: Yes Patient able to express need for assistance with ADLs?: Yes Independently performs ADLs?: Yes (appropriate for developmental age)  Prior Inpatient Therapy Prior Inpatient Therapy: No Prior Therapy Dates: n/a Prior Therapy Facilty/Provider(s): n/a Reason for Treatment: n/a  Prior Outpatient Therapy Prior Outpatient Therapy: No Prior Therapy Dates: n/a Prior Therapy Facilty/Provider(s): n/a Reason for Treatment: n/a Does patient have an ACCT team?: No Does patient have Intensive In-House Services?  : No Does patient have [facility] services? : No Does patient have P4CC services?: No  ADL Screening (condition at time of admission) Patient's cognitive ability adequate to safely complete daily activities?: Yes Is the patient deaf or have difficulty hearing?: No Does the patient have difficulty seeing, even when wearing glasses/contacts?: No Does the patient have difficulty concentrating, remembering, or making decisions?: No Patient able to express need for assistance with ADLs?: Yes Does the patient have difficulty dressing or bathing?: No Independently performs ADLs?: Yes (appropriate for developmental age) Does the patient have difficulty walking or climbing stairs?: No Weakness of Legs: None Weakness of Arms/Hands: None       Abuse/Neglect Assessment (Assessment to be complete  while patient is  alone) Physical Abuse: Denies Verbal Abuse: Denies Sexual Abuse: Denies Exploitation of patient/patient's resources: Denies Self-Neglect: Denies Values / Beliefs Cultural Requests During Hospitalization: None Spiritual Requests During Hospitalization: None   Advance Directives (For Healthcare) Does Patient Have a Medical Advance Directive?: No    Additional Information 1:1 In Past 12 Months?: No CIRT Risk: No Elopement Risk: No Does patient have medical clearance?: Yes     Disposition:  Per Donell SievertSpencer, Simon, PA recommend a.m. Psych evaluation Disposition Initial Assessment Completed for this Encounter: Yes Disposition of Patient: Other dispositions (TBD)  Brittnee Gaetano K Nicoletta Hush 05/28/2016 2:03 AM

## 2016-05-28 NOTE — Progress Notes (Signed)
Patient ID: Brad Saunders, male   DOB: 12-17-1985, 31 y.o.   MRN: [id number] Client is a 31 yo male admitted involuntarily [facility] with lac[facility] to his left wrist. Currently site is wrapped and no drainage noted. Client admits to using Roxicet that he buys off the street, also reports "I drank a bottle of wine last night, I hadn't drank in a long time" Per tele assessment client reported he drank last night and can't remember what else happened or how he got the cuts. Clients UDS was positive for opiates, amphetamines and marijuana. Client is cooperative during this admission, even used some humor, but mostly tense and caution, noted that this was his first inpatient admission. Client contracts for safety. Client is married with three children. Client reports he doesn't know why he's here then reports "I guess cause the way I been acting, like I'm crazy, one moment I be okay and the next I'm not" Admission forms reviewed and client agreeable to treatment. Offered snacks and drink, acclimated to unit. Staff will monitor q2715min for safety. Client is safe on the unit.

## 2016-05-28 NOTE — ED Notes (Signed)
Spouse called - on her way to visit w/pt - advised her pt agreed to see her d/t [facility].

## 2016-05-28 NOTE — ED Notes (Signed)
Pt's spouse called - [phone] - asking if pt had been re-assessed yet. Advised her no. States she wanted to visit pt at 1230 - advised her pt states he does not want to see her or anyone. She requested for RN to keep her updated. Advised her we will as long as pt approves. Offered pt Nicotine and Ativan x 2 - refused.

## 2016-05-28 NOTE — ED Notes (Signed)
Pt's 1 labeled belongings bag placed at nurses' desk for inventory. Pt sitting on side of bed, alert.

## 2016-05-28 NOTE — ED Notes (Addendum)
Pt pacing in room - now asking for Nicotine Patch. Offered pt Ativan - agrees to take. Asked pt if drinks ETOH daily - states no and denies hx DT's/ETOH withdrawal.

## 2016-05-28 NOTE — Progress Notes (Signed)
[facility] after 7:30.  Accepting physician Dr. Jama Flavorsobos.  Call report to [phone].

## 2016-05-28 NOTE — ED Notes (Signed)
Pt aware will be [facility] later this evening. States he is not hungry and has not eaten dinner - ate sandwich prior. Pt lying on bed watching tv.

## 2016-05-28 NOTE — ED Notes (Signed)
Nicotine Patch and Ativan given to pt as requested.

## 2016-05-28 NOTE — ED Notes (Signed)
Pt's spouse aware of tx plan recommendation - inpt tx - advised she will visit at 1730 time.

## 2016-05-29 DIAGNOSIS — F0630 Mood disorder due to known physiological condition, unspecified: Secondary | ICD-10-CM

## 2016-05-29 DIAGNOSIS — Z79899 Other long term (current) drug therapy: Secondary | ICD-10-CM

## 2016-05-29 DIAGNOSIS — F19950 Other psychoactive substance use, unspecified with psychoactive substance-induced psychotic disorder with delusions: Principal | ICD-10-CM

## 2016-05-29 LAB — LIPID PANEL
CHOL/HDL RATIO: 3 ratio
Cholesterol: 117 mg/dL (ref 0–200)
HDL: 39 mg/dL — ABNORMAL LOW (ref >40)
LDL Cholesterol: 57 mg/dL (ref 0–99)
Triglycerides: 103 mg/dL (ref <150)
VLDL: 21 mg/dL (ref 0–40)

## 2016-05-29 LAB — TSH: TSH: 0.546 u[IU]/mL (ref 0.350–4.500)

## 2016-05-29 MED ORDER — NICOTINE 21 MG/24HR TD PT24
21.0000 mg | MEDICATED_PATCH | Freq: Every day | TRANSDERMAL | Status: DC
  Administered 2016-05-29 – 2016-06-02 (×5): 21 mg via TRANSDERMAL
  Filled 2016-05-29 (×8): qty 1

## 2016-05-29 MED ORDER — GABAPENTIN 300 MG PO CAPS
300.0000 mg | ORAL_CAPSULE | Freq: Three times a day (TID) | ORAL | Status: DC
  Administered 2016-05-29 – 2016-06-02 (×13): 300 mg via ORAL
  Filled 2016-05-29 (×3): qty 21
  Filled 2016-05-29 (×15): qty 1

## 2016-05-29 MED ORDER — RISPERIDONE 0.5 MG PO TABS
0.5000 mg | ORAL_TABLET | Freq: Two times a day (BID) | ORAL | Status: DC
  Administered 2016-05-29 – 2016-05-31 (×5): 0.5 mg via ORAL
  Filled 2016-05-29 (×8): qty 1

## 2016-05-29 MED ORDER — TRAZODONE HCL 50 MG PO TABS
50.0000 mg | ORAL_TABLET | Freq: Every evening | ORAL | Status: DC | PRN
  Administered 2016-06-01: 50 mg via ORAL
  Filled 2016-05-29: qty 7

## 2016-05-29 NOTE — Progress Notes (Signed)
D: Pt received in a quiet guarded mood. Denies SI/HI/AVH. Pt gave very short answers and avoided eye contact. He was cooperative with care and took all medications as ordered. His goal for today was to follow all of the unit rules. ? A: Safety checks maintained. Encouraged pt to attend groups. ? R: Attended groups and verbalized no complaints.
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
## 2016-05-29 NOTE — Progress Notes (Signed)
Pt attended AA group this evening.  

## 2016-05-29 NOTE — Tx Team (Signed)
Interdisciplinary Treatment and Diagnostic Plan Update  05/30/2016 Time of Session: 0930 Brad Saunders MRN: [id number]  Principal Diagnosis: Substance-induced psychotic disorder with delusions [facility])  Secondary Diagnoses: Principal Problem:   Substance-induced psychotic disorder with delusions (HCC) Active Problems:   Psychoactive substance-induced mood disorder (HCC)   Current Medications:  Current Facility-Administered Medications  Medication Dose Route Frequency Provider Last Rate Last Dose  . acetaminophen (TYLENOL) tablet 650 mg  650 mg Oral Q6H PRN Truman Hayward, FNP   650 mg at 05/29/16 2149  . alum & mag hydroxide-simeth (MAALOX/MYLANTA) 200-200-20 MG/5ML suspension 30 mL  30 mL Oral Q4H PRN Truman Hayward, FNP      . gabapentin (NEURONTIN) capsule 300 mg  300 mg Oral TID Adonis Brook, NP   300 mg at 05/30/16 0840  . hydrOXYzine (ATARAX/VISTARIL) tablet 25 mg  25 mg Oral TID PRN Truman Hayward, FNP   25 mg at 05/28/16 2318  . magnesium hydroxide (MILK OF MAGNESIA) suspension 30 mL  30 mL Oral Daily PRN Truman Hayward, FNP      . nicotine (NICODERM CQ - dosed in mg/24 hours) patch 21 mg  21 mg Transdermal Daily Kerry Hough, PA-C   21 mg at 05/30/16 0700  . risperiDONE (RISPERDAL) tablet 0.5 mg  0.5 mg Oral BID Georgiann Cocker, MD   0.5 mg at 05/30/16 0840  . traZODone (DESYREL) tablet 50 mg  50 mg Oral QHS PRN Adonis Brook, NP       PTA Medications: No prescriptions prior to admission.    Patient Stressors: Financial difficulties Medication change or noncompliance Substance abuse  Patient Strengths: Active sense of humor Average or above average intelligence Communication skills General fund of knowledge Supportive family/friends  Treatment Modalities: Medication Management, Group therapy, Case management,  1 to 1 session with clinician, Psychoeducation, Recreational therapy.   Physician Treatment Plan for Primary Diagnosis: Substance-induced  psychotic disorder with delusions (HCC) Long Term Goal(s): Improvement in symptoms so as ready for discharge Improvement in symptoms so as ready for discharge   Short Term Goals: Ability to identify changes in lifestyle to reduce recurrence of condition will improve Ability to verbalize feelings will improve Ability to disclose and discuss suicidal ideas Ability to demonstrate self-control will improve Ability to identify and develop effective coping behaviors will improve Ability to maintain clinical measurements within normal limits will improve Compliance with prescribed medications will improve Ability to identify triggers associated with substance abuse/mental health issues will improve Ability to identify changes in lifestyle to reduce recurrence of condition will improve Ability to verbalize feelings will improve Ability to disclose and discuss suicidal ideas Ability to demonstrate self-control will improve Ability to identify and develop effective coping behaviors will improve Ability to maintain clinical measurements within normal limits will improve Compliance with prescribed medications will improve Ability to identify triggers associated with substance abuse/mental health issues will improve  Medication Management: Evaluate patient's response, side effects, and tolerance of medication regimen.  Therapeutic Interventions: 1 to 1 sessions, Unit Group sessions and Medication administration.  Evaluation of Outcomes: Progressing  Physician Treatment Plan for Secondary Diagnosis: Principal Problem:   Substance-induced psychotic disorder with delusions (HCC) Active Problems:   Psychoactive substance-induced mood disorder (HCC)  Long Term Goal(s): Improvement in symptoms so as ready for discharge Improvement in symptoms so as ready for discharge   Short Term Goals: Ability to identify changes in lifestyle to reduce recurrence of condition will improve Ability to verbalize  feelings will improve Ability  to disclose and discuss suicidal ideas Ability to demonstrate self-control will improve Ability to identify and develop effective coping behaviors will improve Ability to maintain clinical measurements within normal limits will improve Compliance with prescribed medications will improve Ability to identify triggers associated with substance abuse/mental health issues will improve Ability to identify changes in lifestyle to reduce recurrence of condition will improve Ability to verbalize feelings will improve Ability to disclose and discuss suicidal ideas Ability to demonstrate self-control will improve Ability to identify and develop effective coping behaviors will improve Ability to maintain clinical measurements within normal limits will improve Compliance with prescribed medications will improve Ability to identify triggers associated with substance abuse/mental health issues will improve     Medication Management: Evaluate patient's response, side effects, and tolerance of medication regimen.  Therapeutic Interventions: 1 to 1 sessions, Unit Group sessions and Medication administration.  Evaluation of Outcomes: Progressing   RN Treatment Plan for Primary Diagnosis: Substance-induced psychotic disorder with delusions (HCC) Long Term Goal(s): Knowledge of disease and therapeutic regimen to maintain health will improve  Short Term Goals: Ability to remain free from injury will improve, Ability to disclose and discuss suicidal ideas and Ability to identify and develop effective coping behaviors will improve  Medication Management: RN will administer medications as ordered by provider, will assess and evaluate patient's response and provide education to patient for prescribed medication. RN will report any adverse and/or side effects to prescribing provider.  Therapeutic Interventions: 1 on 1 counseling sessions, Psychoeducation, Medication administration,  Evaluate responses to treatment, Monitor vital signs and CBGs as ordered, Perform/monitor CIWA, COWS, AIMS and Fall Risk screenings as ordered, Perform wound care treatments as ordered.  Evaluation of Outcomes: Progressing   LCSW Treatment Plan for Primary Diagnosis: Substance-induced psychotic disorder with delusions (HCC) Long Term Goal(s): Safe transition to appropriate next level of care at discharge, Engage patient in therapeutic group addressing interpersonal concerns.  Short Term Goals: Engage patient in aftercare planning with referrals and resources, Facilitate patient progression through stages of change regarding substance use diagnoses and concerns and Identify triggers associated with mental health/substance abuse issues  Therapeutic Interventions: Assess for all discharge needs, 1 to 1 time with Social worker, Explore available resources and support systems, Assess for adequacy in community support network, Educate family and significant other(s) on suicide prevention, Complete Psychosocial Assessment, Interpersonal group therapy.  Evaluation of Outcomes: Progressing   Progress in Treatment: Attending groups: Yes. Participating in groups: Yes. Taking medication as prescribed: Yes. Toleration medication: Yes. Family/Significant other contact made: No, will contact:  family member if patient consents Patient understands diagnosis: Yes. Discussing patient identified problems/goals with staff: Yes. Medical problems stabilized or resolved: Yes. Denies suicidal/homicidal ideation: Yes. Issues/concerns per patient self-inventory: No. Other: n/a  New problem(s) identified: No, Describe:  n/a  New Short Term/Long Term Goal(s): detox; medication stabilization; development of comprehensive mental wellness/sobriety plan.   Discharge Plan or Barriers: CSW assessing for appropriate referrals.   Reason for Continuation of Hospitalization: Depression Medication  stabilization Withdrawal symptoms  Estimated Length of Stay: 2-3 days   Attendees: Patient: 05/30/2016 8:43 AM  Physician: Dr. Jackquline BerlinIzediuno MD 05/30/2016 8:43 AM  Nursing: Shirleen Schirmerhresa, Jane RN 05/30/2016 8:43 AM  RN Care Manager: Onnie BoerJennifer Clark CM 05/30/2016 8:43 AM  Social Worker: Trula SladeHeather Smart, LCSW; Donnelly StagerLynn Bryant LCSWA  05/30/2016 8:43 AM  Recreational Therapist: Juliann ParesX 05/30/2016 8:43 AM  Other: Armandina StammerAgnes Nwoko NP; Gray BernhardtMay Augustin NP 05/30/2016 8:43 AM  Other:  05/30/2016 8:43 AM  Other: 05/30/2016 8:43 AM  Scribe for Treatment Team: Lew Dawes, LCSW 05/30/2016 8:43 AM

## 2016-05-29 NOTE — H&P (Signed)
Psychiatric Admission Assessment Adult  Patient Identification: Brad Saunders MRN:  [id number] Date of Evaluation:  05/29/2016 Chief Complaint:  PSYCHO ACTIVE SUBSTANCE INDUCED MOOD DISORDER Principal Diagnosis: Substance-induced psychotic disorder with delusions (HCC) Diagnosis:   Patient Active Problem List   Diagnosis Date Noted  . 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 Psychoactive substance-induced mood disorder (HCC) [W09.81, F06.30] 05/28/2016   History of Present Illness:  Brad Saunders, 31 yo, male, married with children, was brought in to [facility] by Patent examiner.  He states that he was under the influence of alcohol and he cut his left wrists.  When asked he states that, "what's the use, what's the point?"  Patient was vague about the circumstances of factors that made him commit self injury.  He states that he drinks intermittently not daily.  He states that he has bought controlled substances off the streets.   He states that he has been depressed for a long time but never paid attention it .  Denies previous hospitalizations, mental health provider and medications.  Patient states that he is working and did have concerns when he can return to work or if the meds prescribed will interfere his ability to work.    Associated Signs/Symptoms: Depression Symptoms:  difficulty concentrating, hopelessness, anxiety, (Hypo) Manic Symptoms:  Irritable Mood, Anxiety Symptoms:  Excessive Worry, Psychotic Symptoms:  na PTSD Symptoms: NA Total Time spent with patient: 45 minutes  Past Psychiatric History: see HPI  Is the patient at risk to self? Yes.    Has the patient been a risk to self in the past 6 months? Yes.    Has the patient been a risk to self within the distant past? Yes.    Is the patient a risk to others? Yes.    Has the patient been a risk to others in the past 6 months? No.  Has the patient been a risk to others within the  distant past? No.   Prior Inpatient Therapy:   Prior Outpatient Therapy:    Alcohol Screening: 1. How often do you have a drink containing alcohol?: Monthly or less 2. How many drinks containing alcohol do you have on a typical day when you are drinking?: 1 or 2 3. How often do you have six or more drinks on one occasion?: Never Preliminary Score: 0 9. Have you or someone else been injured as a result of your drinking?: No 10. Has a relative or friend or a doctor or another health worker been concerned about your drinking or suggested you cut down?: Yes, during the last year Alcohol Use Disorder Identification Test Final Score (AUDIT): 5 Brief Intervention: Patient declined brief intervention Substance Abuse History in the last 12 months:  Yes.   Consequences of Substance Abuse: NA Previous Psychotropic Medications: Yes  Psychological Evaluations: No  Past Medical History:  Past Medical History:  Diagnosis Date  . Bipolar 1 disorder (HCC)    per spouse - diagnosed as a teenager   History reviewed. No pertinent surgical history. Family History: History reviewed. No pertinent family history. Family Psychiatric  History: see HPI Tobacco Screening: Have you used any form of tobacco in the last 30 days? (Cigarettes, Smokeless Tobacco, Cigars, and/or Pipes): Yes Tobacco use, Select all that apply: 5 or more cigarettes per day Are you interested in Tobacco Cessation Medications?: Yes, will notify MD for an order Counseled patient on smoking cessation including recognizing danger situations, developing coping skills and basic information about quitting provided:  Refused/Declined practical counseling Social History:  History  Alcohol Use No     History  Drug use: Unknown    Additional Social History: Marital status: Married Number of Years Married: 14 What types of issues is patient dealing with in the relationship?: "she's been saying I'm crazy and need to get help for over a year."   Additional relationship information: She is supportive of patient getting mental health and substance abuse treatmnt per pt.  Are you sexually active?: Yes What is your sexual orientation?: heterosexual  Has your sexual activity been affected by drugs, alcohol, medication, or emotional stress?: n/a  Does patient have children?: Yes How many children?: 4 How is patient's relationship with their children?: four children ages 151, 565, 3010 and 2811. "they are good and happy."                          Allergies:  No Known Allergies Lab Results:  Results for orders placed or performed during the hospital encounter of 05/28/16 (from the past 48 hour(s))  Lipid panel     Status: Abnormal   Collection Time: 05/29/16  6:34 AM  Result Value Ref Range   Cholesterol 117 0 - 200 mg/dL   Triglycerides 782103 <956<150 mg/dL   HDL 39 (L) >21>40 mg/dL   Total CHOL/HDL Ratio 3.0 RATIO   VLDL 21 0 - 40 mg/dL   LDL Cholesterol 57 0 - 99 mg/dL    Comment:        Total Cholesterol/HDL:CHD Risk Coronary Heart Disease Risk Table                     Men   Women  1/2 Average Risk   3.4   3.3  Average Risk       5.0   4.4  2 X Average Risk   9.6   7.1  3 X Average Risk  23.4   11.0        Use the calculated Patient Ratio above and the CHD Risk Table to determine the patient's CHD Risk.        ATP III CLASSIFICATION (LDL):  <100     mg/dL   Optimal  [id number]  mg/dL   Near or Above                    Optimal  130-159  mg/dL   Borderline  [id number]  mg/dL   High  >952>190     mg/dL   Very High Performed at [facility], [address] [place], [place] [address]   TSH     Status: None   Collection Time: 05/29/16  6:34 AM  Result Value Ref Range   TSH 0.546 0.350 - 4.500 uIU/mL    Comment: Performed by a 3rd Generation assay with a functional sensitivity of <=0.01 uIU/mL. Performed at [facility], [address] [place], [place] [address]     Blood Alcohol level:  Lab Results   Component Value Date   ETH 159 (H) 05/27/2016    Metabolic Disorder Labs:  No results found for: HGBA1C, MPG No results found for: PROLACTIN Lab Results  Component Value Date   CHOL 117 05/29/2016   TRIG 103 05/29/2016   HDL 39 (L) 05/29/2016   CHOLHDL 3.0 05/29/2016   VLDL 21 05/29/2016   LDLCALC 57 05/29/2016    Current Medications: Current Facility-Administered Medications  Medication Dose Route Frequency  Provider Last Rate Last Dose  . acetaminophen (TYLENOL) tablet 650 mg  650 mg Oral Q6H PRN Truman Hayward, FNP      . alum & mag hydroxide-simeth (MAALOX/MYLANTA) 200-200-20 MG/5ML suspension 30 mL  30 mL Oral Q4H PRN Truman Hayward, FNP      . gabapentin (NEURONTIN) capsule 300 mg  300 mg Oral TID Adonis Brook, NP      . hydrOXYzine (ATARAX/VISTARIL) tablet 25 mg  25 mg Oral TID PRN Truman Hayward, FNP   25 mg at 05/28/16 2318  . magnesium hydroxide (MILK OF MAGNESIA) suspension 30 mL  30 mL Oral Daily PRN Truman Hayward, FNP      . nicotine (NICODERM CQ - dosed in mg/24 hours) patch 21 mg  21 mg Transdermal Daily Kerry Hough, PA-C   21 mg at 05/29/16 0848  . risperiDONE (RISPERDAL) tablet 0.5 mg  0.5 mg Oral BID Georgiann Cocker, MD   0.5 mg at 05/29/16 1208  . traZODone (DESYREL) tablet 50 mg  50 mg Oral QHS PRN Adonis Brook, NP       PTA Medications: No prescriptions prior to admission.    Musculoskeletal: Strength & Muscle Tone: within normal limits Gait & Station: normal Patient leans: N/A  Psychiatric Specialty Exam:   Physical Exam  Nursing note and vitals reviewed. Psychiatric: His mood appears anxious. His affect is labile. He is agitated. He exhibits a depressed mood.    ROS  Blood pressure 113/68, pulse 86, temperature 98.3 F (36.8 C), temperature source Oral, resp. rate 18, height 5\' 8"  (1.727 m), weight 71.7 kg (158 lb).Body mass index is 24.02 kg/m.  General Appearance: Fairly Groomed  Eye Contact:  Fair  Speech:  Clear and Coherent   Volume:  Normal  Mood:  Anxious, Depressed and Worthless  Affect:  Depressed and Flat  Thought Process:  Coherent  Orientation:  Full (Time, Place, and Person)  Thought Content:  Logical  Suicidal Thoughts:  No  Homicidal Thoughts:  No  Memory:  Immediate;   Fair Recent;   Fair Remote;   Fair  Judgement:  Fair  Insight:  Fair  Psychomotor Activity:  Normal  Concentration:  Concentration: Fair and Attention Span: Fair  Recall:  [employer] of Knowledge:  Fair  Language:  Fair  Akathisia:  No  Handed:  Right  AIMS (if indicated):     Assets:  Desire for Improvement Resilience Social Support  ADL's:  Intact  Cognition:  WNL  Sleep:  Number of Hours: 6.25   Treatment Plan Summary: Admit for crisis management and mood stabilization. Medication management to re-stabilize current mood symptoms Group counseling sessions for coping skills Medical consults as needed Review and reinstate any pertinent home medications for other health problems  Observation Level/Precautions:  15 minute checks  Laboratory:  per ED  Psychotherapy:  group  Medications:  As per medlist, Gabapentin 300 mg TID agitation, anxiety (patient siad that he had been on it before with good effect)  Consultations:  As needed  Discharge Concerns:  safety  Estimated LOS:  2- 7 days  Other:     Physician Treatment Plan for Primary Diagnosis: Substance-induced psychotic disorder with delusions (HCC) Long Term Goal(s): Improvement in symptoms so as ready for discharge  Short Term Goals: Ability to identify changes in lifestyle to reduce recurrence of condition will improve, Ability to verbalize feelings will improve, Ability to disclose and discuss suicidal ideas, Ability to demonstrate self-control will improve, Ability  to identify and develop effective coping behaviors will improve, Ability to maintain clinical measurements within normal limits will improve, Compliance with prescribed medications will improve and  Ability to identify triggers associated with substance abuse/mental health issues will improve  Physician Treatment Plan for Secondary Diagnosis: Principal Problem:   Substance-induced psychotic disorder with delusions (HCC) Active Problems:   Psychoactive substance-induced mood disorder (HCC)  Long Term Goal(s): Improvement in symptoms so as ready for discharge  Short Term Goals: Ability to identify changes in lifestyle to reduce recurrence of condition will improve, Ability to verbalize feelings will improve, Ability to disclose and discuss suicidal ideas, Ability to demonstrate self-control will improve, Ability to identify and develop effective coping behaviors will improve, Ability to maintain clinical measurements within normal limits will improve, Compliance with prescribed medications will improve and Ability to identify triggers associated with substance abuse/mental health issues will improve  I certify that inpatient services furnished can reasonably be expected to improve the patient's condition.    [facility], NP [facility] 2/28/20181:40 PM

## 2016-05-29 NOTE — Progress Notes (Signed)
Recreation Therapy Notes  Date: 05/29/16 Time: 0930 Location: 300 Hall Group Room  Group Topic: Stress Management  Goal Area(s) Addresses:  Patient will verbalize importance of using healthy stress management.  Patient will identify positive emotions associated with healthy stress management.   Intervention: Stress Management  Activity :  Meditation.  LRT played a meditation from the Calm App so patients could practice the technique of meditation.  Patients were to follow along as the meditation was played to engage in the activity.  Education:  Stress Management, Discharge Planning.   Education Outcome: Acknowledges edcuation/In group clarification offered/Needs additional education  Clinical Observations/Feedback: Pt did not attend group.    Caroll RancherMarjette Shikira Folino, LRT/CTRS         Caroll RancherLindsay, Melanie Openshaw A 05/29/2016 12:43 PM

## 2016-05-29 NOTE — [facility] Group Notes (Signed)
[facility] LCSW Group Therapy  05/29/2016 11:50 AM  Type of Therapy:  Group Therapy  Participation Level:  Did Not Attend-pt pulled out of group to be seen by provider---excused.   Summary of Progress/Problems: Today's Topic: Overcoming Obstacles. Patients identified one short term goal and potential obstacles in reaching this goal. Patients processed barriers involved in overcoming these obstacles. Patients identified steps necessary for overcoming these obstacles and explored motivation (internal and external) for facing these difficulties head on.   Brad Pincus N Smart LCSW 05/29/2016, 11:50 AM

## 2016-05-29 NOTE — Progress Notes (Signed)
CPS report made at request of Jonnalagadda, MD due to both parents being active substance abusers with 4 children in the home. This CSW signing off. Please contact should new need(s) arise.    Enos FlingAshley Howell Groesbeck, MSW, LCSW [facility] ED/24M Clinical Social Worker [phone]

## 2016-05-30 LAB — HEMOGLOBIN A1C
Hgb A1c MFr Bld: 4.9 % (ref 4.8–5.6)
MEAN PLASMA GLUCOSE: 94 mg/dL

## 2016-05-30 LAB — PROLACTIN: Prolactin: 15.9 ng/mL — ABNORMAL HIGH (ref 4.0–15.2)

## 2016-05-30 NOTE — Progress Notes (Signed)
Nursing Progress Note: 7p-7a D: Pt currently presents with a sullen affect and behavior. Pt states "my hand hurts something fierce, but I did want or need anything for it." Interacting well with milieu. Pt reports good sleep with current medication regimen.   A: Pt provided with medications per providers orders. Pt's labs and vitals were monitored throughout the night. Pt supported emotionally and encouraged to express concerns and questions. Pt educated on medications.  R: Pt's safety ensured with 15 minute and environmental checks. Pt currently denies SI/HI/Self Harm and AVH. Pt verbally contracts to seek staff if SI/HI or A/VH occurs and to consult with staff before acting on any harmful thoughts. Will continue to monitor.

## 2016-05-30 NOTE — [facility] Group Notes (Signed)
[facility] Group Notes:  (Nursing/MHT/Case Management/Adjunct)  Date:  05/30/2016  Time:  10:18 AM  Type of Therapy:  Nurse Education  Participation Level:  Did Not Attend   Pt was invited but did not attend.   Maurine SimmeringShugart, Ovetta Bazzano M 05/30/2016, 10:18 AM

## 2016-05-30 NOTE — Progress Notes (Signed)
Adult Psychoeducational Group Note  Date:  05/30/2016 Time:  11:48 PM  Group Topic/Focus:  Wrap-Up Group:   The focus of this group is to help patients review their daily goal of treatment and discuss progress on daily workbooks.  Participation Level:  Minimal  Participation Quality:  Inattentive  Affect:  Irritable  Cognitive:  Alert  Insight: Lacking  Engagement in Group:  Lacking  Modes of Intervention:  Discussion  Additional Comments: PT opted out of participating in group, stating that he did  not have any goals for his treatment.  Kaleen OdeaCOOKE, [place] Espina R 05/30/2016, 11:48 PM

## 2016-05-30 NOTE — Progress Notes (Signed)
Nursing Progress Note: 7p-7a D: Pt currently presents with a flat affect and behavior. Interacting minimally with milieu. Pt reports good sleep with current medication regimen.   A: Pt provided with medications per providers orders. Pt's labs and vitals were monitored throughout the night. Pt supported emotionally and encouraged to express concerns and questions. Pt educated on medications.  R: Pt's safety ensured with 15 minute and environmental checks. Pt currently denies SI/HI/Self Harm and AVH. Pt verbally contracts to seek staff if SI/HI or A/VH occurs and to consult with staff before acting on any harmful thoughts. Will continue to monitor.

## 2016-05-30 NOTE — Progress Notes (Signed)
[facility] MD Progress Note  05/30/2016 10:59 AM Brad Saunders  MRN:  [id number] Subjective:   31 yo Caucasian male, married, employed, lives with his family. Background history of SUD. Involuntarily committed on account of suicidal behavior. Patient was brought in by emergency services and the police. He was psychotic at home. He was intoxicated with amphetamine, THC and opiates. He lacerated his left wrist. At the ER he was amnestic of what happened. Says he could not recall how he did it. He was agitated at the ER and felt he did not need to be in hospital.  BAL was 159 at presentation.  Seen today. Says he has realized that drugs is the issue. Says once he gets high on drugs, he tends to lose track of of time and events. Says he has no explanation for what he did other than the effect of drugs. Patient states that he has to deal with his addiction. Says he was at an NA/AA meeting yesterday. He has realized that life has it's ups and downs. Says he has always used drugs to cope with downs. He plans to figure out healthier ways of coping. Says he is the problem at home. Says once he uses drugs, things are not done the way they should have been done. Says his habit takes away funds for the kids. Patient says he spoke with his wife yesterday. Social services are involved now. They have asked his wife to take a drug test. He is worried their kids would be taken away from them. Patient says his wife does use drugs but he feels she is a good mother. Patient says he hopes to be discharged soon as he needs to work and take care of his family.  He feels clearer today. He has not experienced any perceptual abnormality. Patient says he had a similar experience while in jail for a month. Says with each day off street drugs, he felt better. No thoughts of suicide. No thoughts of violence. He works as a Surveyor, minerals. Says his uncle is aware he is in hospital. No other stressors at this time. He is tolerating his medications  well.   Nursing staff reports that he slept well last night. He has not voiced any futility thoughts. No behavioral issues.  Principal Problem: Substance-induced psychotic disorder with delusions (HCC) Diagnosis:   Patient Active Problem List   Diagnosis Date Noted  . 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 Psychoactive substance-induced mood disorder (HCC) [W09.81, F06.30] 05/28/2016   Total Time spent with patient: 20 minutes  Past Psychiatric History: As in H&P  Past Medical History:  Past Medical History:  Diagnosis Date  . Bipolar 1 disorder (HCC)    per spouse - diagnosed as a teenager   History reviewed. No pertinent surgical history. Family History: History reviewed. No pertinent family history. Family Psychiatric  History: As in H&P Social History:  History  Alcohol Use No     History  Drug use: Unknown    Social History   Social History  . Marital status: Single    Spouse name: N/A  . Number of children: N/A  . Years of education: N/A   Social History Main Topics  . Smoking status: Current Every Day Smoker  . Smokeless tobacco: Never Used  . Alcohol use No  . Drug use: Unknown  . Sexual activity: Yes    Birth control/ protection: None   Other Topics Concern  . None   Social History Narrative  .  None   Additional Social History:      Sleep: Good  Appetite:  Fair  Current Medications: Current Facility-Administered Medications  Medication Dose Route Frequency Provider Last Rate Last Dose  . acetaminophen (TYLENOL) tablet 650 mg  650 mg Oral Q6H PRN Truman Hayward, FNP   650 mg at 05/29/16 2149  . alum & mag hydroxide-simeth (MAALOX/MYLANTA) 200-200-20 MG/5ML suspension 30 mL  30 mL Oral Q4H PRN Truman Hayward, FNP      . gabapentin (NEURONTIN) capsule 300 mg  300 mg Oral TID Adonis Brook, NP   300 mg at 05/30/16 0840  . hydrOXYzine (ATARAX/VISTARIL) tablet 25 mg  25 mg Oral TID PRN Truman Hayward,  FNP   25 mg at 05/28/16 2318  . magnesium hydroxide (MILK OF MAGNESIA) suspension 30 mL  30 mL Oral Daily PRN Truman Hayward, FNP      . nicotine (NICODERM CQ - dosed in mg/24 hours) patch 21 mg  21 mg Transdermal Daily Kerry Hough, PA-C   21 mg at 05/30/16 0700  . risperiDONE (RISPERDAL) tablet 0.5 mg  0.5 mg Oral BID Georgiann Cocker, MD   0.5 mg at 05/30/16 0840  . traZODone (DESYREL) tablet 50 mg  50 mg Oral QHS PRN Adonis Brook, NP        Lab Results:  Results for orders placed or performed during the hospital encounter of 05/28/16 (from the past 48 hour(s))  Hemoglobin A1c     Status: None   Collection Time: 05/29/16  6:34 AM  Result Value Ref Range   Hgb A1c MFr Bld 4.9 4.8 - 5.6 %    Comment: (NOTE)         Pre-diabetes: 5.7 - 6.4         Diabetes: >6.4         Glycemic control for adults with diabetes: <7.0    Mean Plasma Glucose 94 mg/dL    Comment: (NOTE) Performed At: [facility] [address] [place], [place] [id number] Mila Homer MD WU:[id number] Performed at [facility] [address], [place], [place] [address]   Lipid panel     Status: Abnormal   Collection Time: 05/29/16  6:34 AM  Result Value Ref Range   Cholesterol 117 0 - 200 mg/dL   Triglycerides 308 <657 mg/dL   HDL 39 (L) >84 mg/dL   Total CHOL/HDL Ratio 3.0 RATIO   VLDL 21 0 - 40 mg/dL   LDL Cholesterol 57 0 - 99 mg/dL    Comment:        Total Cholesterol/HDL:CHD Risk Coronary Heart Disease Risk Table                     Men   Women  1/2 Average Risk   3.4   3.3  Average Risk       5.0   4.4  2 X Average Risk   9.6   7.1  3 X Average Risk  23.4   11.0        Use the calculated Patient Ratio above and the CHD Risk Table to determine the patient's CHD Risk.        ATP III CLASSIFICATION (LDL):  <100     mg/dL   Optimal  696-295  mg/dL   Near or Above                    Optimal  130-159  mg/dL  Borderline  160-189  mg/dL   High  >161     mg/dL    Very High Performed at [facility], [address] [place], [place] [address]   Prolactin     Status: Abnormal   Collection Time: 05/29/16  6:34 AM  Result Value Ref Range   Prolactin 15.9 (H) 4.0 - 15.2 ng/mL    Comment: (NOTE) Performed At: [facility] [address] [place], [place] [id number] Mila Homer MD [id number] Performed at [facility], [address] [place], [place] [address]   TSH     Status: None   Collection Time: 05/29/16  6:34 AM  Result Value Ref Range   TSH 0.546 0.350 - 4.500 uIU/mL    Comment: Performed by a 3rd Generation assay with a functional sensitivity of <=0.01 uIU/mL. Performed at [facility], [address] [place], [place] [address]     Blood Alcohol level:  Lab Results  Component Value Date   ETH 159 (H) 05/27/2016    Metabolic Disorder Labs: Lab Results  Component Value Date   HGBA1C 4.9 05/29/2016   MPG 94 05/29/2016   Lab Results  Component Value Date   PROLACTIN 15.9 (H) 05/29/2016   Lab Results  Component Value Date   CHOL 117 05/29/2016   TRIG 103 05/29/2016   HDL 39 (L) 05/29/2016   CHOLHDL 3.0 05/29/2016   VLDL 21 05/29/2016   LDLCALC 57 05/29/2016    Physical Findings: AIMS: Facial and Oral Movements Muscles of Facial Expression: None, normal Lips and Perioral Area: None, normal Jaw: None, normal Tongue: None, normal,Extremity Movements Upper (arms, wrists, hands, fingers): None, normal Lower (legs, knees, ankles, toes): None, normal, Trunk Movements Neck, shoulders, hips: None, normal, Overall Severity Severity of abnormal movements (highest score from questions above): None, normal Incapacitation due to abnormal movements: None, normal Patient's awareness of abnormal movements (rate only patient's report): No Awareness, Dental Status Current problems with teeth and/or dentures?: No Does patient usually wear dentures?: No  CIWA:  CIWA-Ar Total:  1 COWS:  COWS Total Score: 1  Musculoskeletal: Strength & Muscle Tone: within normal limits Gait & Station: normal Patient leans: N/A  Psychiatric Specialty Exam: Physical Exam  Constitutional: He is oriented to person, place, and time. He appears well-developed and well-nourished.  HENT:  Head: Normocephalic and atraumatic.  Eyes: Conjunctivae are normal. Pupils are equal, round, and reactive to light.  Neck: Normal range of motion. Neck supple.  Cardiovascular: Normal rate, regular rhythm and normal heart sounds.   Respiratory: Effort normal and breath sounds normal.  GI: Soft. Bowel sounds are normal.  Musculoskeletal: Normal range of motion.  Neurological: He is alert and oriented to person, place, and time. He has normal reflexes.  Skin: Skin is warm and dry.  Psychiatric:  As above.     ROS  Blood pressure 112/78, pulse 69, temperature 98.3 F (36.8 C), temperature source Oral, resp. rate 18, height 5\' 8"  (1.727 m), weight 71.7 kg (158 lb).Body mass index is 24.02 kg/m.  General Appearance: In hospital clothing. Was asleep initially. Paced around the room throughout interview. Minimal eye contact. Not distracted by internal stimulation.   Eye Contact:  Minimal  Speech:  Clear and Coherent. Not pressured.   Volume:  Normal  Mood:  Worried and down  Affect:  Blunt and Congruent  Thought Process:  Coherent and Linear  Orientation:  Full (Time, Place, and Person)  Thought Content: Processing the effects his  addiction has had on his family. Contemplating change.  No delusional theme. No preoccupation with violent thoughts.   No hallucination in any modality.   Suicidal Thoughts:  No  Homicidal Thoughts:  No  Memory:  Immediate;   Good Recent;   Fair Remote;   Fair  Judgement:  better  Insight:  Good  Psychomotor Activity:  Increased  Concentration:  Concentration: Fair and Attention Span: Fair  Recall:  [employer]  Fund of Knowledge:  Good  Language:  Good  Akathisia:  No   Handed:    AIMS (if indicated):     Assets:  Desire for Improvement Financial Resources/Insurance Housing Intimacy Physical Health Resilience Talents/Skills  ADL's:  Impaired  Cognition:  WNL  Sleep:  Number of Hours: 6.75     Treatment Plan Summary: Patient is coming off street drugs smoothly. He is tolerating his medication well. He is processing effects of substances on his life. No dangerousness at this time. We are still evaluating him.  Psychiatric: Substance Induced Psychotic Disorder SUD  Medical:  Psychosocial:   PLAN: 1. Collateral from his family 2. Encourage unit groups and activities 3. Monitor mood, behavior and interaction with peers 4. Motivational enhancement  5. Likely discharge by Sunday if he clears up and collateral rules out any risk issues.   Georgiann CockerVincent A Nyssa Sayegh, MD 05/30/2016, 10:59 AM

## 2016-05-30 NOTE — [facility] Suicide Risk Assessment (Signed)
[facility] INPATIENT:  Family/Significant Other Suicide Prevention Education  Suicide Prevention Education:  Education Completed; Brad Saunders (wife [phone]), has been identified by the patient as the family member/significant other with whom the patient will be residing, and identified as the person(s) who will aid the patient in the event of a mental health crisis (suicidal ideations/suicide attempt).  With written consent from the patient, the family member/significant other has been provided the following suicide prevention education, prior to the and/or following the discharge of the patient.  The suicide prevention education provided includes the following:  Suicide risk factors  Suicide prevention and interventions  National Suicide Hotline telephone number  [facility] assessment telephone number  [facility] Emergency Assistance 911  [facility] and/or [facility]idential [facility] Crisis Unit telephone number  Request made of family/significant other to:  Remove weapons (e.g., guns, rifles, knives), all items previously/currently identified as safety concern.    Remove drugs/medications (over-the-counter, prescriptions, illicit drugs), all items previously/currently identified as a safety concern.  The family member/significant other verbalizes understanding of the suicide prevention education information provided.  The family member/significant other agrees to remove the items of safety concern listed above.  Brad Saunders 05/30/2016, 2:03 PM

## 2016-05-30 NOTE — [facility] Group Notes (Signed)
[facility] 05/30/2016 1:15pm  Type of Therapy: Mental Health Association Presentation  Participation Level: Active  Participation Quality: Attentive  Affect: Appropriate  Cognitive: Oriented  Insight: Developing/Improving  Engagement in Therapy: Engaged  Modes of Intervention: Discussion, Education and Socialization  Summary of Progress/Problems: Mental Health Association (MHA) Speaker came to talk about his personal journey with substance abuse and addiction. The pt processed ways by which to relate to the speaker. MHA speaker provided handouts and educational information pertaining to groups and services offered by the [facility]. Pt was engaged in speaker's presentation and was receptive to resources provided.    Vito BackersLynn B. Beverely PaceBryant, MSW, LCSWA 05/30/2016 2:08 PM

## 2016-05-30 NOTE — [facility] Suicide Risk Assessment (Addendum)
[facility] INPATIENT:  Family/Significant Other Suicide Prevention Education  Suicide Prevention Education:  Contact Attempts: [name] (wife [phone]), has been identified by the patient as the family member/significant other with whom the patient will be residing, and identified as the person(s) who will aid the patient in the event of a mental health crisis.  With written consent from the patient, two attempts were made to provide suicide prevention education, prior to and/or following the patient's discharge.  We were unsuccessful in providing suicide prevention education.  A suicide education pamphlet was given to the patient to share with family/significant other.  Date and time of first attempt: 05/30/16 @ 1:40pm. Message left.  Pt's wife called back and SPE was completed.  Brad JordanLynn [name] 05/30/2016, 1:45 PM

## 2016-05-30 NOTE — Progress Notes (Signed)
D: Wilber OliphantCaleb is quiet and minimally interactive. He denies SI, HI, and AVH. He is calm and appropriate. He reported good sleep, good appetite, normal energy level, and good concentration. On his self inventory, he rated his depression, feeling of hopelessness, and anxiety all zero.   A: Meds given as ordered. No PRNs requested or required. Q15 safety checks maintained. Support/encouragement offered.  R: Pt remains free from harm and continues with treatment. Will continue to monitor for needs/safety.

## 2016-05-31 MED ORDER — RISPERIDONE 0.5 MG PO TABS
0.5000 mg | ORAL_TABLET | Freq: Once | ORAL | Status: AC
  Administered 2016-05-31: 0.5 mg via ORAL
  Filled 2016-05-31: qty 1

## 2016-05-31 MED ORDER — RISPERIDONE 0.5 MG PO TABS
0.5000 mg | ORAL_TABLET | Freq: Once | ORAL | Status: DC
  Filled 2016-05-31: qty 1

## 2016-05-31 MED ORDER — RISPERIDONE 0.5 MG PO TABS
0.5000 mg | ORAL_TABLET | Freq: Every day | ORAL | Status: DC
  Administered 2016-05-31 – 2016-06-02 (×3): 0.5 mg via ORAL
  Filled 2016-05-31 (×4): qty 1

## 2016-05-31 MED ORDER — RISPERIDONE 2 MG PO TABS
2.0000 mg | ORAL_TABLET | Freq: Every day | ORAL | Status: DC
  Administered 2016-05-31 – 2016-06-01 (×2): 2 mg via ORAL
  Filled 2016-05-31 (×3): qty 1

## 2016-05-31 NOTE — Progress Notes (Addendum)
D Diontay remains quite anxious, with a flat blunted affect. He spends much of his time sleeping and laying in his bed. When he awakens, he admits that he feels " claustraphobic" and " I don't want to be here..I  want to go home". A He completed his daily assessment this morning and on this form he wrote he denied SI today and he rated his depression, hopelessness and anxiety " 0/0/0", respectively. He admits thisa fternoon that he does not " like to be here, that he feels caged in...like an animal" and says " I don't like to be around a lot of people...you know". R He was given  Prn of po vistaril and po risperdal, both at 1516, for c/o anxiety and agitation. Safety in place and dc planned for Sunday.

## 2016-05-31 NOTE — Progress Notes (Signed)
[facility]  05/31/2016 12:04 PM Brad BoxerCaleb Saunders  MRN:  [id number] Subjective:   31 yo Caucasian male, married, employed, lives with his family. Background history of SUD. Involuntarily committed on account of suicidal behavior. Patient was brought in by emergency services and the police. He was psychotic at home. He was intoxicated with amphetamine, THC and opiates. He lacerated his left wrist. At the ER he was amnestic of what happened. Says he could not recall how he did it. He was agitated at the ER and felt he did not need to be in hospital.  BAL was 159 at presentation.  Nursing staff reports that he is coming out more. He is inattentive at groups. Minimal participation at groups. He isolates self and still seems restless. He denies internal stimulation. He denies any thoughts of violence. He denies any thoughts of suicide.  Seen today. Says he feels as if people here wants to set him up. Says they are trying to "get the other Brad OliphantCaleb out". Patient would not elaborate on how they are trying to set him up. Says he is okay and just wants to leave soon. Patient denies hallucinations in all modalities. He denies any current thoughts of suicide. Patient states that his wife did visit yesterday. She has been drug tested. The kids are still at home and they are doing good. No side effects from his medication.  SW spoke with his wife. Suicide and violence preventive measures were discussed. She did not voice any new concerns.   Principal Problem: Substance-induced psychotic disorder with delusions (HCC) Diagnosis:   Patient Active Problem List   Diagnosis Date Noted  . 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 Psychoactive substance-induced mood disorder (HCC) [N82.95[F19.94, F06.30] 05/28/2016   Total Time spent with patient: 20 minutes  Past Psychiatric History: As in H&P  Past Medical History:  Past Medical History:  Diagnosis Date  . Bipolar 1  disorder (HCC)    per spouse - diagnosed as a teenager   History reviewed. No pertinent surgical history. Family History: History reviewed. No pertinent family history. Family Psychiatric  History: As in H&P Social History:  History  Alcohol Use No     History  Drug use: Unknown    Social History   Social History  . Marital status: Single    Spouse name: N/A  . Number of children: N/A  . Years of education: N/A   Social History Main Topics  . Smoking status: Current Every Day Smoker  . Smokeless tobacco: Never Used  . Alcohol use No  . Drug use: Unknown  . Sexual activity: Yes    Birth control/ protection: None   Other Topics Concern  . None   Social History Narrative  . None   Additional Social History:      Sleep: Good  Appetite:  Fair  Current Medications: Current Facility-Administered Medications  Medication Dose Route Frequency Provider Last Rate Last Dose  . acetaminophen (TYLENOL) tablet 650 mg  650 mg Oral Q6H PRN Truman Haywardakia S Starkes, FNP   650 mg at 05/29/16 2149  . alum & mag hydroxide-simeth (MAALOX/MYLANTA) 200-200-20 MG/5ML suspension 30 mL  30 mL Oral Q4H PRN Truman Haywardakia S Starkes, FNP      . gabapentin (NEURONTIN) capsule 300 mg  300 mg Oral TID Adonis BrookSheila Agustin, NP   300 mg at 05/31/16 0749  . hydrOXYzine (ATARAX/VISTARIL) tablet 25 mg  25 mg Oral TID PRN Truman Haywardakia S Starkes, FNP   25 mg  at 05/28/16 2318  . magnesium hydroxide (MILK OF MAGNESIA) suspension 30 mL  30 mL Oral Daily PRN Truman Hayward, FNP      . nicotine (NICODERM CQ - dosed in mg/24 hours) patch 21 mg  21 mg Transdermal Daily Kerry Hough, PA-C   21 mg at 05/31/16 0800  . risperiDONE (RISPERDAL) tablet 0.5 mg  0.5 mg Oral BID Georgiann Cocker, MD   0.5 mg at 05/31/16 0749  . traZODone (DESYREL) tablet 50 mg  50 mg Oral QHS PRN Adonis Brook, NP        Lab Results:  No results found for this or any previous visit (from the past 48 hour(s)).  Blood Alcohol level:  Lab Results  Component  Value Date   ETH 159 (H) 05/27/2016    Metabolic Disorder Labs: Lab Results  Component Value Date   HGBA1C 4.9 05/29/2016   MPG 94 05/29/2016   Lab Results  Component Value Date   PROLACTIN 15.9 (H) 05/29/2016   Lab Results  Component Value Date   CHOL 117 05/29/2016   TRIG 103 05/29/2016   HDL 39 (L) 05/29/2016   CHOLHDL 3.0 05/29/2016   VLDL 21 05/29/2016   LDLCALC 57 05/29/2016    Physical Findings: AIMS: Facial and Oral Movements Muscles of Facial Expression: None, normal Lips and Perioral Area: None, normal Jaw: None, normal Tongue: None, normal,Extremity Movements Upper (arms, wrists, hands, fingers): None, normal Lower (legs, knees, ankles, toes): None, normal, Trunk Movements Neck, shoulders, hips: None, normal, Overall Severity Severity of abnormal movements (highest score from questions above): None, normal Incapacitation due to abnormal movements: None, normal Patient's awareness of abnormal movements (rate only patient's report): No Awareness, Dental Status Current problems with teeth and/or dentures?: No Does patient usually wear dentures?: No  CIWA:  CIWA-Ar Total: 1 COWS:  COWS Total Score: 1  Musculoskeletal: Strength & Muscle Tone: within normal limits Gait & Station: normal Patient leans: N/A  Psychiatric Specialty Exam: Physical Exam  Constitutional: He is oriented to person, place, and time. He appears well-developed and well-nourished.  HENT:  Head: Normocephalic and atraumatic.  Eyes: Conjunctivae are normal. Pupils are equal, round, and reactive to light.  Neck: Normal range of motion. Neck supple.  Cardiovascular: Normal rate, regular rhythm and normal heart sounds.   Respiratory: Effort normal and breath sounds normal.  GI: Soft. Bowel sounds are normal.  Musculoskeletal: Normal range of motion.  Neurological: He is alert and oriented to person, place, and time. He has normal reflexes.  Skin: Skin is warm and dry.  Psychiatric:  As  above.     ROS  Blood pressure 121/71, pulse 75, temperature 97.8 F (36.6 C), temperature source Oral, resp. rate 18, height 5\' 8"  (1.727 m), weight 71.7 kg (158 lb).Body mass index is 24.02 kg/m.  General Appearance: Better grooming today. Was at the day room watching TV with peers. At interview, keeps looking over his shoulders. Tense and postures a bit. Seems distracted internally.  Eye Contact:  Minimal  Speech:  Clear and Coherent. Not pressured.   Volume:  Normal  Mood:  Worried and down  Affect:  Blunt and Congruent  Thought Process:  Coherent and Linear  Orientation:  Full (Time, Place, and Person)  Thought Content: Paranoid and persecutory delusions. Worried he might go off. Seems to hallucinate though he denies this.   Suicidal Thoughts:  No  Homicidal Thoughts:  No  Memory:  Immediate;   Good Recent;   Fair Remote;  Fair  Judgement: Fair  Insight:  Limited  Psychomotor Activity:  Increased  Concentration:  Concentration: Fair and Attention Span: Fair  Recall:  [employer] of Knowledge:  Good  Language:  Good  Akathisia:  No  Handed:    AIMS (if indicated):     Assets:  Desire for Improvement Financial Resources/Insurance Housing Intimacy Physical Health Resilience Talents/Skills  ADL's:  Impaired  Cognition:  WNL  Sleep:  Number of Hours: 6.25     Treatment Plan Summary: Patient is paranoid and seems distracted by internal stimulation. We discussed medication adjustment as documented below. He consented to treatment.   Psychiatric: Substance Induced Psychotic Disorder SUD  Medical:  Psychosocial:   PLAN: 1. Increase PM Risperidone to 2 mg 2. Encourage unit groups and activities 3. Continue to monitor mood, behavior and interaction with peers   Georgiann Cocker, MD 05/31/2016, 12:04 PMPatient ID: Brad Saunders, male   DOB: Dec 10, 1985, 31 y.o.   MRN: [id number]

## 2016-05-31 NOTE — Tx Team (Signed)
Interdisciplinary Treatment and Diagnostic Plan Update  05/31/2016 Time of Session: 0930 Brantly Kalman MRN: [id number]  Principal Diagnosis: Substance-induced psychotic disorder with delusions [facility])  Secondary Diagnoses: Principal Problem:   Substance-induced psychotic disorder with delusions ([place]) Active Problems:   Psychoactive substance-induced mood disorder ([place])   Current Medications:  Current Facility-Administered Medications  Medication Dose Route Frequency Provider Last Rate Last Dose  . acetaminophen (TYLENOL) tablet 650 mg  650 mg Oral Q6H PRN Nanci Pina, FNP   650 mg at 05/29/16 2149  . alum & mag hydroxide-simeth (MAALOX/MYLANTA) 200-200-20 MG/5ML suspension 30 mL  30 mL Oral Q4H PRN Nanci Pina, FNP      . gabapentin (NEURONTIN) capsule 300 mg  300 mg Oral TID Kerrie Buffalo, NP   300 mg at 05/31/16 1303  . hydrOXYzine (ATARAX/VISTARIL) tablet 25 mg  25 mg Oral TID PRN Nanci Pina, FNP   25 mg at 05/28/16 2318  . magnesium hydroxide (MILK OF MAGNESIA) suspension 30 mL  30 mL Oral Daily PRN Nanci Pina, FNP      . nicotine (NICODERM CQ - dosed in mg/24 hours) patch 21 mg  21 mg Transdermal Daily Laverle Hobby, PA-C   21 mg at 05/31/16 0800  . [START ON 06/01/2016] risperiDONE (RISPERDAL) tablet 0.5 mg  0.5 mg Oral Daily Vincent A Izediuno, MD      . risperiDONE (RISPERDAL) tablet 2 mg  2 mg Oral QHS Artist Beach, MD      . traZODone (DESYREL) tablet 50 mg  50 mg Oral QHS PRN Kerrie Buffalo, NP       PTA Medications: No prescriptions prior to admission.    Patient Stressors: Financial difficulties Medication change or noncompliance Substance abuse  Patient Strengths: Active sense of humor Average or above average intelligence Communication skills General fund of knowledge Supportive family/friends  Treatment Modalities: Medication Management, Group therapy, Case management,  1 to 1 session with clinician, Psychoeducation, Recreational  therapy.   Physician Treatment Plan for Primary Diagnosis: Substance-induced psychotic disorder with delusions ([place]) Long Term Goal(s): Improvement in symptoms so as ready for discharge Improvement in symptoms so as ready for discharge   Short Term Goals: Ability to identify changes in lifestyle to reduce recurrence of condition will improve Ability to verbalize feelings will improve Ability to disclose and discuss suicidal ideas Ability to demonstrate self-control will improve Ability to identify and develop effective coping behaviors will improve Ability to maintain clinical measurements within normal limits will improve Compliance with prescribed medications will improve Ability to identify triggers associated with substance abuse/mental health issues will improve Ability to identify changes in lifestyle to reduce recurrence of condition will improve Ability to verbalize feelings will improve Ability to disclose and discuss suicidal ideas Ability to demonstrate self-control will improve Ability to identify and develop effective coping behaviors will improve Ability to maintain clinical measurements within normal limits will improve Compliance with prescribed medications will improve Ability to identify triggers associated with substance abuse/mental health issues will improve  Medication Management: Evaluate patient's response, side effects, and tolerance of medication regimen.  Therapeutic Interventions: 1 to 1 sessions, Unit Group sessions and Medication administration.  Evaluation of Outcomes: Met/Adequate for discharge   Physician Treatment Plan for Secondary Diagnosis: Principal Problem:   Substance-induced psychotic disorder with delusions ([place]) Active Problems:   Psychoactive substance-induced mood disorder ([place])  Long Term Goal(s): Improvement in symptoms so as ready for discharge Improvement in symptoms so as ready for discharge   Short  Term Goals: Ability to  identify changes in lifestyle to reduce recurrence of condition will improve Ability to verbalize feelings will improve Ability to disclose and discuss suicidal ideas Ability to demonstrate self-control will improve Ability to identify and develop effective coping behaviors will improve Ability to maintain clinical measurements within normal limits will improve Compliance with prescribed medications will improve Ability to identify triggers associated with substance abuse/mental health issues will improve Ability to identify changes in lifestyle to reduce recurrence of condition will improve Ability to verbalize feelings will improve Ability to disclose and discuss suicidal ideas Ability to demonstrate self-control will improve Ability to identify and develop effective coping behaviors will improve Ability to maintain clinical measurements within normal limits will improve Compliance with prescribed medications will improve Ability to identify triggers associated with substance abuse/mental health issues will improve     Medication Management: Evaluate patient's response, side effects, and tolerance of medication regimen.  Therapeutic Interventions: 1 to 1 sessions, Unit Group sessions and Medication administration.  Evaluation of Outcomes: Met   RN Treatment Plan for Primary Diagnosis: Substance-induced psychotic disorder with delusions ([place]) Long Term Goal(s): Knowledge of disease and therapeutic regimen to maintain health will improve  Short Term Goals: Ability to remain free from injury will improve, Ability to disclose and discuss suicidal ideas and Ability to identify and develop effective coping behaviors will improve  Medication Management: RN will administer medications as ordered by provider, will assess and evaluate patient's response and provide education to patient for prescribed medication. RN will report any adverse and/or side effects to prescribing  provider.  Therapeutic Interventions: 1 on 1 counseling sessions, Psychoeducation, Medication administration, Evaluate responses to treatment, Monitor vital signs and CBGs as ordered, Perform/monitor CIWA, COWS, AIMS and Fall Risk screenings as ordered, Perform wound care treatments as ordered.  Evaluation of Outcomes: Met   LCSW Treatment Plan for Primary Diagnosis: Substance-induced psychotic disorder with delusions ([place]) Long Term Goal(s): Safe transition to appropriate next level of care at discharge, Engage patient in therapeutic group addressing interpersonal concerns.  Short Term Goals: Engage patient in aftercare planning with referrals and resources, Facilitate patient progression through stages of change regarding substance use diagnoses and concerns and Identify triggers associated with mental health/substance abuse issues  Therapeutic Interventions: Assess for all discharge needs, 1 to 1 time with Social worker, Explore available resources and support systems, Assess for adequacy in community support network, Educate family and significant other(s) on suicide prevention, Complete Psychosocial Assessment, Interpersonal group therapy.  Evaluation of Outcomes: Met/Adequate for discharge   Progress in Treatment: Attending groups: Yes. Participating in groups: Yes. Taking medication as prescribed: Yes. Toleration medication: Yes. Family/Significant other contact made: SPE completed with pt's wife.  Patient understands diagnosis: Yes. Discussing patient identified problems/goals with staff: Yes. Medical problems stabilized or resolved: Yes. Denies suicidal/homicidal ideation: Yes. Issues/concerns per patient self-inventory: No. Other: n/a  New problem(s) identified: No, Describe:  n/a  New Short Term/Long Term Goal(s): detox; medication stabilization; development of comprehensive mental wellness/sobriety plan.   Discharge Plan or Barriers: Pt plans to return home with his wife;  follow-up at [facility] in place. Pt given [facility] and NA/AA information for [employer].   Reason for Continuation of Hospitalization: Mood and Medication stabilization  Estimated Length of Stay: tentative discharge for Sunday per Dr. Sanjuana Letters if pt remains stable.   Attendees: Patient: 05/31/2016 3:02 PM  Physician: Dr. Sanjuana Letters MD 05/31/2016 3:02 PM  Nursing: Resa Miner RN 05/31/2016 3:02 PM  RN Care Manager: Lars Pinks [facility] 05/31/2016 3:02 PM  Social Worker: Press photographer, LCSW; 05/31/2016 3:02 PM  Recreational Therapist: Rhunette Croft 05/31/2016 3:02 PM  Other: Lindell Spar NP; Ricky Ala NP 05/31/2016 3:02 PM  Other:  05/31/2016 3:02 PM  Other: 05/31/2016 3:02 PM    Scribe for Treatment Team: [place], LCSW 05/31/2016 3:02 PM

## 2016-05-31 NOTE — Progress Notes (Signed)
Recreation Therapy Notes  Date: 05/31/16 Time: 0930 Location: 300 [address] Dayroom  Group Topic: Stress Management  Goal Area(s) Addresses:  Patient will verbalize importance of using healthy stress management.  Patient will identify positive emotions associated with healthy stress management.   Intervention: Stress Management  Activity :  Meditation.  LRT introduced the stress management technique of meditation.  LRT played a meditation on resilience from the Calm app to allow the patients to engage in the technique.  Patients were to follow along as the meditation played to participate in the technique.   Education:  Stress Management, Discharge Planning.   Education Outcome: Acknowledges edcuation/In group clarification offered/Needs additional education  Clinical Observations/Feedback: Pt did not attend group.   Caroll RancherMarjette Oneill Bais, LRT/CTRS         Caroll RancherLindsay, Louis Ivery A 05/31/2016 12:25 PM

## 2016-05-31 NOTE — [facility] Group Notes (Signed)
[facility] LCSW Group Therapy  05/31/2016 12:59 PM  Type of Therapy:  Group Therapy  Participation Level:  Minimal  Participation Quality:  Resistant  Affect:  Irritable  Cognitive:  Lacking  Insight:  Limited  Engagement in Therapy:  Limited  Modes of Intervention:  Confrontation, Discussion, Education, Problem-solving, Socialization and Support  Summary of Progress/Problems: Self Sabotage: Patients were asked to identify examples of self-sabotoge that they have experienced and talk about how to avoid this behavior in the future.   Porsche Noguchi N Smart LCSW 05/31/2016, 12:59 PM

## 2016-05-31 NOTE — Progress Notes (Signed)
Patient ID: Brad Saunders, male   DOB: 1985-06-10, 31 y.o.   MRN: [id number] D: Client is visible on the unit seen in dayroom watching TV, interacting with peers. Client reports "everything is good" "everybody been real nice" Client reports he feels like the medication(Risperdal) is working. " seem like I cont' get mad with that Risperdal" "My wife came today and she noticed the difference" "I want to continue taking it at home, I don't know how much its' going to be but I want to try to get it" "Maybe I can take the money I save from buying drugs and get my medicine" A: Writer provided emotional support, encouraged client to consider speaking to the physician and SW about medications he will be taking on discharge. Medications reviewed, administered as ordered. Staff will monitor q7915min for safety. R: Client is safe on the unit, attended group.

## 2016-05-31 NOTE — Progress Notes (Signed)
  [facility] :  Will you be returning to the same living situation after discharge:  Yes,  home with wife At discharge, do you have transportation home?: Yes,  wife plans to pick him up at discharge-scheduled for SUNDAY per MD Do you have the ability to pay for your medications: Yes,  mental health  Release of information consent forms completed and submitted to medical records by CSW.  Patient to Follow up at: [facility]: Walk in between 8am-9am Monday through Friday for hospital follow-up/medication management/assessment for counseling and substance abuse services. Please walk in to be seen within 7 days of hospital discharge. Thank you.    Next level of care provider [facility]no  Safety Planning and Suicide Prevention discussed: Yes,  SPE completed with pt's wife  Have you used any form of tobacco in the last 30 days? (Cigarettes, Smokeless Tobacco, Cigars, and/or Pipes): Yes  Has patient been referred to the Quitline?: Patient refused referral  Patient has been referred for addiction treatment: Yes  Tuleen Mandelbaum N Smart LCSW 05/31/2016, 3:04 PM

## 2016-06-01 NOTE — [facility] Group Notes (Signed)
[facility] LCSW Group Therapy Note  06/01/2016 at 10:00 AM  Type of Therapy and Topic:  Group Therapy: Avoiding Self-Sabotaging and Enabling Behaviors  Participation Level: Active  Participation Quality:  Resistant  Affect:  Depressed  Cognitive: oriented  Insight: Minimal  Engagement in Therapy:  Developing  Therapeutic models used Cognitive Behavioral Therapy Person-Centered Therapy Motivational Interviewing   Summary of Patient Progress: The main focus of today's process group was to discuss what "self-sabotage" means and use Motivational Interviewing to discuss what benefits, negative or positive, were involved in a self-identified self-sabotaging behavior. We then talked about reasons the patient may want to change the behavior and their current desire to change. Patients were encouraged to process the personal cost of their negative habits.  Pt displayed hopelessness to point peers were trying to encourage him.    Carney Bernatherine C Araya Roel, LCSW

## 2016-06-01 NOTE — Progress Notes (Signed)
Data. Patient denies SI/HI/AVH. Patient interacting well with staff and other patients. Affect is bright and patient denies any ongoing emotional issues in the am, however just before dinner, patient came to nurse and stated, "I don't know why, I am just really depressed right now. I was doing really well, playing basketball and everything, but now I just feel to depressed." Patient affect very flat and does not brighten with interaction. Educated patient on changing his internal self talk and the medications he was taking for both depression and mood stabilization. patient was able to verbalize his understanding. . Minimizing his recent self harm and substance use. On his self assessment patient reports 0/10 for anxiety, depression and hopelessness. His goal for today is: "Relax not have anxiety depression!" Action. Emotional support and encouragement offered. Education provided on medication, indications and side effect. Q 15 minute checks done for safety. Response. Safety on the unit maintained through 15 minute checks.  Medications taken as prescribed. Attended groups. Remained calm and appropriate through out shift.

## 2016-06-01 NOTE — Plan of Care (Signed)
Problem: Health Behavior/Discharge Planning: Goal: Identification of resources available to assist in meeting health care needs will improve Outcome: Progressing ID of resources available to assist in meeting health care needs will improve AEB client acknowledging he can save money he was using to buy drugs and use to assist in purchasing his medications.

## 2016-06-01 NOTE — Progress Notes (Signed)
D    Pt is pleasant on approach and cooperative   Pt reported he had an episode of feeling depressed earlier in the day but said he feels some better   He continues to grieve his loss of his partner  Pt denies hallucinations and does not appear to be responding to internal stimuli A    Verbal support given    Medications administered and effectiveness monitored    Q 15 min checks    R   Pt is safe at present time

## 2016-06-01 NOTE — Progress Notes (Signed)
[facility] MD Progress Note  06/01/2016 4:14 PM Brad Saunders  MRN:  [id number] Subjective:   31 yo Caucasian male, married, employed, lives with his family. Background history of SUD. Involuntarily committed on account of suicidal behavior. Patient was brought in by emergency services and the police. He was psychotic at home. He was intoxicated with amphetamine, THC and opiates. He lacerated his left wrist. At the ER he was amnestic of what happened. Says he could not recall how he did it. He was agitated at the ER and felt he did not need to be in hospital.  BAL was 159 at presentation.  Nursing staff notes that he was anxious and odd yesterday evening. He got better after we gave him a start dose of Risperidone. His PM dose was adjusted last night. Patient has been more interactive since then. He acknowledges that the medication has helped him feel better. He has been participating at groups. He is beginning to process the effects of his habits on his life in general. He is no longer internally distracted. No behavioral issues. He slept well last night. He has been out with peers. His wife visited him yesterday and commented on improvement he has made.   Seen today in company of his family. States he is feel much better. He has been out of his room the whole day. Went out to the gymn with peers. Says his mind is clearer now. No longer feels as if any one is out to get him. No other delusions. No suicidal thoughts. No homicidal thoughts. No thoughts of violence. his wife and sister feels that he is back to his old self. They want him home tomorrow.  Principal Problem: Substance-induced psychotic disorder with delusions ([facility]) Diagnosis:   Patient Active Problem List   Diagnosis Date Noted  . 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 Psychoactive substance-induced mood disorder ([facility]) [W09.81, F06.30] 05/28/2016   Total Time spent with patient: 20 minutes  Past  Psychiatric History: As in H&P  Past Medical History:  Past Medical History:  Diagnosis Date  . Bipolar 1 disorder ([facility])    per spouse - diagnosed as a teenager   History reviewed. No pertinent surgical history. Family History: History reviewed. No pertinent family history. Family Psychiatric  History: As in H&P Social History:  History  Alcohol Use No     History  Drug use: Unknown    Social History   Social History  . Marital status: Single    Spouse name: N/A  . Number of children: N/A  . Years of education: N/A   Social History Main Topics  . Smoking status: Current Every Day Smoker  . Smokeless tobacco: Never Used  . Alcohol use No  . Drug use: Unknown  . Sexual activity: Yes    Birth control/ protection: None   Other Topics Concern  . None   Social History Narrative  . None   Additional Social History:      Sleep: Good  Appetite:  Fair  Current Medications: Current Facility-Administered Medications  Medication Dose Route Frequency Provider Last Rate Last Dose  . acetaminophen (TYLENOL) tablet 650 mg  650 mg Oral Q6H PRN Truman Hayward, FNP   650 mg at 05/29/16 2149  . alum & mag hydroxide-simeth (MAALOX/MYLANTA) 200-200-20 MG/5ML suspension 30 mL  30 mL Oral Q4H PRN Truman Hayward, FNP      . gabapentin (NEURONTIN) capsule 300 mg  300 mg Oral TID Adonis Brook, NP  300 mg at 06/01/16 1158  . hydrOXYzine (ATARAX/VISTARIL) tablet 25 mg  25 mg Oral TID PRN Truman Hayward, FNP   25 mg at 05/31/16 1516  . magnesium hydroxide (MILK OF MAGNESIA) suspension 30 mL  30 mL Oral Daily PRN Truman Hayward, FNP      . nicotine (NICODERM CQ - dosed in mg/24 hours) patch 21 mg  21 mg Transdermal Daily Kerry Hough, PA-C   21 mg at 06/01/16 0748  . risperiDONE (RISPERDAL) tablet 0.5 mg  0.5 mg Oral Daily Georgiann Cocker, MD   0.5 mg at 06/01/16 0749  . risperiDONE (RISPERDAL) tablet 0.5 mg  0.5 mg Oral Once Georgiann Cocker, MD      . risperiDONE (RISPERDAL)  tablet 2 mg  2 mg Oral QHS Georgiann Cocker, MD   2 mg at 05/31/16 2131  . traZODone (DESYREL) tablet 50 mg  50 mg Oral QHS PRN Adonis Brook, NP        Lab Results:  No results found for this or any previous visit (from the past 48 hour(s)).  Blood Alcohol level:  Lab Results  Component Value Date   ETH 159 (H) 05/27/2016    Metabolic Disorder Labs: Lab Results  Component Value Date   HGBA1C 4.9 05/29/2016   MPG 94 05/29/2016   Lab Results  Component Value Date   PROLACTIN 15.9 (H) 05/29/2016   Lab Results  Component Value Date   CHOL 117 05/29/2016   TRIG 103 05/29/2016   HDL 39 (L) 05/29/2016   CHOLHDL 3.0 05/29/2016   VLDL 21 05/29/2016   LDLCALC 57 05/29/2016    Physical Findings: AIMS: Facial and Oral Movements Muscles of Facial Expression: None, normal Lips and Perioral Area: None, normal Jaw: None, normal Tongue: None, normal,Extremity Movements Upper (arms, wrists, hands, fingers): None, normal Lower (legs, knees, ankles, toes): None, normal, Trunk Movements Neck, shoulders, hips: None, normal, Overall Severity Severity of abnormal movements (highest score from questions above): None, normal Incapacitation due to abnormal movements: None, normal Patient's awareness of abnormal movements (rate only patient's report): No Awareness, Dental Status Current problems with teeth and/or dentures?: No Does patient usually wear dentures?: No  CIWA:  CIWA-Ar Total: 1 COWS:  COWS Total Score: 1  Musculoskeletal: Strength & Muscle Tone: within normal limits Gait & Station: normal Patient leans: N/A  Psychiatric Specialty Exam: Physical Exam  Constitutional: He is oriented to person, place, and time. He appears well-developed and well-nourished.  HENT:  Head: Normocephalic and atraumatic.  Eyes: Conjunctivae are normal. Pupils are equal, round, and reactive to light.  Neck: Normal range of motion. Neck supple.  Cardiovascular: Normal rate, regular rhythm and  normal heart sounds.   Respiratory: Effort normal and breath sounds normal.  GI: Soft. Bowel sounds are normal.  Musculoskeletal: Normal range of motion.  Neurological: He is alert and oriented to person, place, and time. He has normal reflexes.  Skin: Skin is warm and dry.  Psychiatric:  As above.     ROS  Blood pressure 108/60, pulse 90, temperature 97.9 F (36.6 C), temperature source Oral, resp. rate 18, height 5\' 8"  (1.727 m), weight 71.7 kg (158 lb).Body mass index is 24.02 kg/m.  Appearance; Neatly dressed, pleasant, engaging well and cooperative. Appropriate behavior. Not in any distress. Good relatedness. Not internally stimulated   Eye Contact:  Good  Speech:  Spontaneous, normal prosody. Normal tone and rate. .   Volume:  Normal  Mood:  Euthymic  Affect:  Full range and appropriate  Thought Process:  Coherent and Linear  Orientation:  Full (Time, Place, and Person)  Thought Content: No delusional theme. No preoccupation with violent thoughts. No negative ruminations. No obsession.  No hallucination in any modality.    Suicidal Thoughts:  No  Homicidal Thoughts:  No  Memory:  WNL  Judgement: Good  Insight:  Good  Psychomotor Activity:  Normal  Concentration:  Good  Recall:  Good  Fund of Knowledge:  Good  Language:  Good  Akathisia:  No  Handed:    AIMS (if indicated):     Assets:  Desire for Improvement Financial Resources/Insurance Housing Intimacy Physical Health Resilience Talents/Skills  ADL's:  Impaired  Cognition:  WNL  Sleep:  Number of Hours: 6.5     Treatment Plan Summary: Patient is doing much better. He has responded to medication adjustment. He is no longer psychotic. No dangerousness.   Psychiatric: Substance Induced Psychotic Disorder SUD  Medical:  Psychosocial:   PLAN: 1. Continue current regimen 2. For discharge tomorrow.    Georgiann CockerVincent A Allaina Brotzman, MD 06/01/2016, 4:14 PMPatient ID: Lillette Boxeraleb Hopkins, male   DOB: Dec 26, 1985, 31  y.o.   MRN: [id number] Patient ID: Lillette BoxerCaleb Younge, male   DOB: Dec 26, 1985, 31 y.o.   MRN: [id number]

## 2016-06-01 NOTE — [facility] Group Notes (Signed)
[facility] Group Notes:  (Nursing/MHT/Case Management/Adjunct)  Date:  06/01/2016  Time:  4:34 PM  Type of Therapy:  Nurse Education  Participation Level:  Active  Participation Quality:  Appropriate  Affect:  Appropriate  Cognitive:  Appropriate  Insight:  Appropriate  Engagement in Group:  Engaged  Modes of Intervention:  Discussion  Summary of Progress/Problems:  Education group regarding communication styles. Patient was able to identify his style as- "Passive/Aggressive."  Almira Barenny G Rc Amison 06/01/2016, 4:34 PM

## 2016-06-01 NOTE — Plan of Care (Signed)
Problem: Medication: Goal: Compliance with prescribed medication regimen will improve Outcome: Progressing Patient is taking medications as prescribed.   

## 2016-06-02 MED ORDER — GABAPENTIN 300 MG PO CAPS: 300.0000 mg | ORAL_CAPSULE | Freq: Three times a day (TID) | ORAL | 0 refills | Status: AC

## 2016-06-02 MED ORDER — RISPERIDONE 0.5 MG PO TABS: 0.5000 mg | ORAL_TABLET | Freq: Once | ORAL | 0 refills | Status: DC

## 2016-06-02 MED ORDER — TRAZODONE HCL 50 MG PO TABS: 50.0000 mg | ORAL_TABLET | Freq: Every evening | ORAL | 0 refills | Status: AC | PRN

## 2016-06-02 MED ORDER — RISPERIDONE 3 MG PO TABS: 3.0000 mg | ORAL_TABLET | Freq: Every day | ORAL | 0 refills | Status: AC

## 2016-06-02 MED ORDER — HYDROXYZINE HCL 25 MG PO TABS: 25.0000 mg | ORAL_TABLET | Freq: Three times a day (TID) | ORAL | 0 refills | Status: AC | PRN

## 2016-06-02 MED ORDER — NICOTINE 21 MG/24HR TD PT24: 21.0000 mg | MEDICATED_PATCH | Freq: Every day | TRANSDERMAL | 0 refills | Status: AC

## 2016-06-02 MED ORDER — RISPERIDONE 3 MG PO TABS
3.0000 mg | ORAL_TABLET | Freq: Every day | ORAL | Status: DC
  Filled 2016-06-02: qty 7
  Filled 2016-06-02: qty 1

## 2016-06-02 NOTE — Progress Notes (Signed)
Data. Patient denies SI/HI/AVH. Patient interacting well with staff and other patients. Affect is anxious and patient reports, "I am very anxious about my discharge today. I have to go to see my boss and landlord and I am anxious about those confrontations." Reinforces education about coping skills and communication styles to use in difficult situations. Patient stated, "I  Know know that the medications won't fix me. Ii have to put in the mental work too." On his self assessment patient reports 0/10 for depression and hopelessness and 8/10 for anxiety. His goal for today is: 'I deon't know I go home at 8711 and I'm worried and scared to face my boss and family high anxiety." Action. Emotional support and encouragement offered. Education provided on medication, indications and side effect. Q 15 minute checks done for safety. Response. Safety on the unit maintained through 15 minute checks.  Medications taken as prescribed. Attended groups. Remained calm and appropriate through out shift.  Pt. discharged to lobby.  Belongings sheet reviewed and signed by pt. and all belongings, including scripts and sample medications,  sent home. Paperwork reviewed and pt. able to verbalize understanding of education. Pt. in no current distress and ambulatory.

## 2016-06-02 NOTE — Discharge Summary (Signed)
Physician Discharge Summary Note  Patient:  Brad Saunders is an 31 y.o., male MRN:  [id number] DOB:  09/25/1985 Patient phone:  [phone] (home)  Patient address:   [address] [place] [address],  Total Time spent with patient: Greater than 30 minutes  Date of Admission:  05/28/2016 Date of Discharge: 06-02-16  Reason for Admission: Suspected suicide attempt while under the influence of alcohol & other drugs.  Principal Problem: Substance-induced psychotic disorder with delusions [facility])  Discharge Diagnoses: Patient Active Problem List   Diagnosis Date Noted  . 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 Psychoactive substance-induced mood disorder (HCC) [O13.08, F06.30] 05/28/2016   Past Psychiatric History: Substance induced mood disorder with delusions.  Past Medical History:  Past Medical History:  Diagnosis Date  . Bipolar 1 disorder (HCC)    per spouse - diagnosed as a teenager   History reviewed. No pertinent surgical history.  Family History: History reviewed. No pertinent family history.  Family Psychiatric  History: See H&P  Social History:  History  Alcohol Use No     History  Drug use: Unknown    Social History   Social History  . Marital status: Single    Spouse name: N/A  . Number of children: N/A  . Years of education: N/A   Social History Main Topics  . Smoking status: Current Every Day Smoker  . Smokeless tobacco: Never Used  . Alcohol use No  . Drug use: Unknown  . Sexual activity: Yes    Birth control/ protection: None   Other Topics Concern  . None   Social History Narrative  . None   Hospital Course:  Brad Saunders is 31 yo, male, married with children, was brought in to [facility] by Patent examiner.  He states that he was under the influence of alcohol and he cut his left wrists.  When asked he states that, "what's the use, what's the point?"  Patient was vague about the circumstances of  factors that made him commit self injury.  He states that he drinks intermittently not daily.  He states that he has bought controlled substances off the streets. He states that he has been depressed for a long time but never paid attention it .  Denies previous hospitalizations, mental health provider and medications.  Patient states that he is working and did have concerns when he can return to work or if the meds prescribed will interfere his ability to work.   While a patient here at [facility], Brad Saunders was placed on medication regimen for his presenting symptoms. He received no detoxification treatment regimen as he was not presenting with any substance withdrawal symptoms. His UDS was positive for Amphetamine, Opioid & THC & a BAL of 159 per toxicology result. He was however, encouraged to confront the fact that he does have substance abuse issues and should deal with the problem appropriately by receiving substance abuse treatments. Brad Saunders was medicated & discharged on; Gabapentin 300 mg for agitation, Hydroxyzine 25 mg prn for anxiety, Nicotine patch 21 mg for smoking cessation, Risperidone 0.5 mg for agitation/anxiety, Risperdal 3 mg for mood control & Trazodone 50 mg for insomnia.   Besides the mood stabilization treatment, Brad Saunders was enrolled & participated in the scheduled group therapy and activities being held on this unit. He presented on daily basis an improved mood & decreased anxiety levels. He is discharged to his home with family to follow-up care on outpatient basis as noted below. He was discharged  with all personal belongings & provided with a 7 days worth, supply [facility]. He left [facility] in no apparent distress. Transportation per family.   Physical Findings: AIMS: Facial and Oral Movements Muscles of Facial Expression: None, normal Lips and Perioral Area: None, normal Jaw: None, normal Tongue: None, normal,Extremity Movements Upper (arms, wrists, hands,  fingers): None, normal Lower (legs, knees, ankles, toes): None, normal, Trunk Movements Neck, shoulders, hips: None, normal, Overall Severity Severity of abnormal movements (highest score from questions above): None, normal Incapacitation due to abnormal movements: None, normal Patient's awareness of abnormal movements (rate only patient's report): No Awareness, Dental Status Current problems with teeth and/or dentures?: No Does patient usually wear dentures?: No  CIWA:  CIWA-Ar Total: 1 COWS:  COWS Total Score: 1  Musculoskeletal: Strength & Muscle Tone: within normal limits Gait & Station: normal Patient leans: N/A  Psychiatric Specialty Exam: Physical Exam  Constitutional: He is oriented to person, place, and time. He appears well-developed.  HENT:  Head: Normocephalic.  Eyes: Pupils are equal, round, and reactive to light.  Cardiovascular: Normal rate.   Respiratory: Effort normal.  GI: Soft.  Genitourinary:  Genitourinary Comments: Deferred  Musculoskeletal: Normal range of motion.  Neurological: He is alert and oriented to person, place, and time.  Skin: Skin is warm and dry.    Review of Systems  Constitutional: Negative.   HENT: Negative.   Eyes: Negative.   Respiratory: Negative.   Cardiovascular: Negative.   Gastrointestinal: Negative.   Genitourinary: Negative.   Musculoskeletal: Negative.   Skin: Negative.   Neurological: Negative.   Endo/Heme/Allergies: Negative.   Psychiatric/Behavioral: Positive for depression and substance abuse (Hx. Polysubstance use disorder ). Negative for hallucinations, memory loss and suicidal ideas. The patient has insomnia (Stable). The patient is not nervous/anxious.     Blood pressure (!) 110/58, pulse (!) 117, temperature 97.6 F (36.4 C), temperature source Oral, resp. rate 20, height 5\' 8"  (1.727 m), weight 71.7 kg (158 lb).Body mass index is 24.02 kg/m.  See Md's SRA   Have you used any form of tobacco in the last 30  days? (Cigarettes, Smokeless Tobacco, Cigars, and/or Pipes): Yes  Has this patient used any form of tobacco in the last 30 days? (Cigarettes, Smokeless Tobacco, Cigars, and/or Pipes): Yes, provided with Nicotine 21 mg patch prescription upon discharge.  Blood Alcohol level:  Lab Results  Component Value Date   ETH 159 (H) 05/27/2016    Metabolic Disorder Labs:  Lab Results  Component Value Date   HGBA1C 4.9 05/29/2016   MPG 94 05/29/2016   Lab Results  Component Value Date   PROLACTIN 15.9 (H) 05/29/2016   Lab Results  Component Value Date   CHOL 117 05/29/2016   TRIG 103 05/29/2016   HDL 39 (L) 05/29/2016   CHOLHDL 3.0 05/29/2016   VLDL 21 05/29/2016   LDLCALC 57 05/29/2016   See Psychiatric Specialty Exam and Suicide Risk Assessment completed by Attending Physician prior to discharge.  Discharge destination:  Home  Is patient on multiple antipsychotic therapies at discharge:  No   Has Patient had three or more failed trials of antipsychotic monotherapy by history:  No  Recommended Plan for Multiple Antipsychotic Therapies: NA  Allergies as of 06/02/2016   No Known Allergies     Medication List    TAKE these medications     Indication  gabapentin 300 MG capsule Commonly known as:  NEURONTIN Take 1 capsule (300 mg total) by mouth 3 (  three) times daily. For agitation  Indication:  Agitation   hydrOXYzine 25 MG tablet Commonly known as:  ATARAX/VISTARIL Take 1 tablet (25 mg total) by mouth 3 (three) times daily as needed for anxiety.  Indication:  Anxiety Neurosis   nicotine 21 mg/24hr patch Commonly known as:  NICODERM CQ - dosed in mg/24 hours Place 1 patch (21 mg total) onto the skin daily. For smoking cessation Start taking on:  06/03/2016  Indication:  Nicotine Addiction   risperiDONE 0.5 MG tablet Commonly known as:  RISPERDAL Take 1 tablet (0.5 mg total) by mouth once. For mood control  Indication:  Mood control   risperiDONE 3 MG tablet Commonly  known as:  RISPERDAL Take 1 tablet (3 mg total) by mouth at bedtime. For mood control  Indication:  Mood control   traZODone 50 MG tablet Commonly known as:  DESYREL Take 1 tablet (50 mg total) by mouth at bedtime as needed for sleep.  Indication:  Trouble Sleeping      Follow-up Information    [facility] Follow up.   Specialty:  Behavioral Health Why:  Walk in between 8am-9am Monday through Friday for hospital follow-up/medication management/assessment for counseling and substance abuse services. Please walk in to be seen within 7 days of hospital discharge. Thank you.   [address] [place] [address] [phone]          Follow-up recommendations:  Activity:  As tolerated Diet: As recommended by your primary care doctor. Keep all scheduled follow-up appointments as recommended.  Comments: Patient is instructed prior to discharge to: Take all medications as prescribed by his/her mental healthcare provider. Report any adverse effects and or reactions from the medicines to his/her outpatient provider promptly. Patient has been instructed & cautioned: To not engage in alcohol and or illegal drug use while on prescription medicines. In the event of worsening symptoms, patient is instructed to call the crisis hotline, 911 and or go to the nearest ED for appropriate evaluation and treatment of symptoms. To follow-up with his/her primary care provider for your other medical issues, concerns and or health care needs.   Signed: Sanjuana Kava, NP, PMHNP, FNP-BC 06/02/2016, 10:09 AM

## 2016-06-02 NOTE — [facility] Suicide Risk Assessment (Signed)
[facility] Discharge Suicide Risk Assessment   Principal Problem: Substance-induced psychotic disorder with delusions [facility]) Discharge Diagnoses:  Patient Active Problem List   Diagnosis Date Noted  . 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 Psychoactive substance-induced mood disorder (HCC) [Z61.09, F06.30] 05/28/2016    Total Time spent with patient: 30 minutes  Musculoskeletal: Strength & Muscle Tone: within normal limits Gait & Station: normal Patient leans: N/A  Psychiatric Specialty Exam: Review of Systems  Constitutional: Negative.   HENT: Negative.   Eyes: Negative.   Respiratory: Negative.   Cardiovascular: Negative.   Gastrointestinal: Negative.   Genitourinary: Negative.   Musculoskeletal: Negative.   Skin: Negative.   Neurological: Negative.   Endo/Heme/Allergies: Negative.   Psychiatric/Behavioral: Negative for depression, hallucinations, memory loss, substance abuse and suicidal ideas. The patient is not nervous/anxious and does not have insomnia.     Blood pressure (!) 110/58, pulse (!) 117, temperature 97.6 F (36.4 C), temperature source Oral, resp. rate 20, height 5\' 8"  (1.727 m), weight 71.7 kg (158 lb).Body mass index is 24.02 kg/m.  General Appearance: Neatly dressed, pleasant, engaging well and cooperative. Appropriate behavior. Not in any distress. Good relatedness. Not internally stimulated  Eye Contact::  Good  Speech: Spontaneous, normal prosody. Normal tone and rate.   Volume:  Normal  Mood:  Euthymic  Affect:  Appropriate and Full Range  Thought Process:  Logical and goal directed  Orientation:  Full (Time, Place, and Person)  Thought Content:  No delusional theme. No preoccupation with violent thoughts. No negative ruminations. No obsession.  No hallucination in any modality.   Suicidal Thoughts:  No  Homicidal Thoughts:  No  Memory:  Immediate;   Good Recent;   Good Remote;   Good  Judgement:  Good   Insight:  Good  Psychomotor Activity:  Normal  Concentration:  Good  Recall:  Good  Fund of Knowledge:Good  Language: Good  Akathisia:  No  Handed:    AIMS (if indicated):     Assets:  Communication Skills Desire for Improvement Financial Resources/Insurance Housing Intimacy Physical Health Resilience Social Support Vocational/Educational  Sleep:  Number of Hours: 6.5  Cognition: WNL  ADL's:  Intact   Clinical  Assessment::   31 yo Caucasian male, married, employed, lives with his family. Background history of SUD. Involuntarily committed on account of suicidal behavior. Patient was brought in by emergency services and the police. He was psychotic at home. He was intoxicated with amphetamine, THC and opiates. He lacerated his left wrist. At the ER he was amnestic of what happened. Says he could not recall how he did it. He was agitated at the ER and felt he did not need to be in hospital. BAL was 159 at presentation  Seen today. Patient states that he is feeling well. He now knows that it is not reality when he thought people were out to get him. Says he used to feel they knew what was going on in his head. Patient says with the medication he has come to realize that "it was all in my head". Says he no longer feels as if the government is after him or watching him. He feels at ease with himself. Says he is now able to interact with other people normally. He is no longer having the inward restlessness and anxiety that he had at presentation. Patient says though he does not have insurance, he should be able to pay for his medication. Says he was spending over $200 weekly  on street drugs. Says now he has to put that into his medication. Patient has realistic worries about child protection services being involved. Says at least they did not take away the children. He is also worried about how he would confront his uncle. Says his uncle is his boss. Feels his uncle would be disappointed with  him. We discussed ways of coping with these. We emphasized the need to assert himself and not escape via drug use. Patient talked about tools he acquired here and how he would use them to cope in a healthy way. We agreed to round up his medication as it would be cheaper and would help him cope on the outside ( Risperidone 3 mg HS only). No thoughts of suicide. No thoughts of violence. No thoughts of homicide. No craving for substances. No hallucination in all modality. No delusional theme. No passivity of will. No passivity of thought. He is able to think clearly. No evidence of mania. No overwhelming anxiety. He does not feel depressed.   Nursing entry last night was an error for another patient. Nursing staff reports that patient has been appropriate on the unit. Patient has been interacting well with peers. No behavioral issues. Patient has not voiced any suicidal thoughts. Patient has not been observed to be internally stimulated. Patient has been adherent with treatment recommendations. Patient has been tolerating their medication well.   Team agrees that patient is at his baseline. Team agrees with discharge plan.  Demographic Factors:  Male  Loss Factors: NA  Historical Factors: Impulsivity  Risk Reduction Factors:   Responsible for children under 32 years of age, Sense of responsibility to family, Employed, Living with another person, especially a relative, Positive social support, Positive therapeutic relationship and Positive coping skills or problem solving skills  Continued Clinical Symptoms:  As above  Cognitive Features That Contribute To Risk:  None    Suicide Risk:  Minimal: No identifiable suicidal ideation.   Patient is not having any thoughts of suicide at this time. Modifiable risk factors targeted during this admission includes mood disorder and substance use. Demographical and historical risk factors cannot be modified. Patient is now engaging well. Patient is  reliable and is future oriented. We have buffered patient's support structures. At this point, patient is at low risk of suicide. Patient is aware of the effects of psychoactive substances on decision making process. Patient has been provided with emergency contacts. Patient acknowledges to use resources provided if unforseen circumstances changes their current risk stratification.    Follow-up Information    [facility] Follow up.   Specialty:  Behavioral Health Why:  Walk in between 8am-9am Monday through Friday for hospital follow-up/medication management/assessment for counseling and substance abuse services. Please walk in to be seen within 7 days of hospital discharge. Thank you.   Contact information: [address] [place] [address] [phone]           Plan Of Care/Follow-up recommendations:  1. Continue current psychotropic medications 2. Mental health and addiction follow up as arranged.  3. Discharge in care of their family  Georgiann CockerVincent A Izediuno, MD 06/02/2016, 9:04 AM

## 2016-07-17 ENCOUNTER — Encounter (HOSPITAL_COMMUNITY): Payer: Self-pay

## 2016-07-17 ENCOUNTER — Emergency Department (HOSPITAL_COMMUNITY)
Admission: EM | Admit: 2016-07-17 | Discharge: 2016-07-17 | Disposition: A | Discharge status: Home / Self Care | Payer: Self-pay | Attending: Emergency Medicine | Admitting: Emergency Medicine

## 2016-07-17 DIAGNOSIS — F17200 Nicotine dependence, unspecified, uncomplicated: Secondary | ICD-10-CM | POA: Insufficient documentation

## 2016-07-17 DIAGNOSIS — Z79899 Other long term (current) drug therapy: Secondary | ICD-10-CM | POA: Insufficient documentation

## 2016-07-17 DIAGNOSIS — R21 Rash and other nonspecific skin eruption: Secondary | ICD-10-CM | POA: Insufficient documentation

## 2016-07-17 MED ORDER — PREDNISONE 20 MG PO TABS: 40.0000 mg | ORAL_TABLET | Freq: Every day | ORAL | 0 refills | Status: AC

## 2016-07-17 MED ORDER — TRIAMCINOLONE ACETONIDE 0.1 % EX CREA: 1.0000 "application " | TOPICAL_CREAM | Freq: Two times a day (BID) | CUTANEOUS | 0 refills | Status: AC

## 2016-07-17 NOTE — ED Triage Notes (Addendum)
Pt states he has rash all over his body. Rash noted bilaterally to arms. He reports using a new lotion which could have caused the rash. Pt reports hx of allergic rxn to laundry detergent. Pt denies shortness of breath.

## 2016-07-17 NOTE — ED Provider Notes (Signed)
[facility]-EMERGENCY DEPT Provider Note   CSN: [id number] Arrival date & time: 07/17/16  1417  By signing my name below, I, Majel Homer, attest that this documentation has been prepared under the direction and in the presence of [employer], PA-C . Electronically Signed: Majel Homer, Scribe. 07/17/2016. 3:24 PM.  History   Chief Complaint Chief Complaint  Patient presents with  . Rash   The history is provided by the patient. No language interpreter was used.   HPI Comments: Brad Saunders is a 31 y.o. male who presents to the Emergency Department complaining of a gradually worsening, generalized, Itchy rash that began ~4 days ago. Pt reports his rash began on his right forearm and progressively spread throughout his entire body. He states he recently began using his wife's Nivea Lotion at home and believes this could have caused his symptoms. He notes hx of a similar rash a few years ago after using a new laundry detergent. Pt denies any nausea, vomiting, or abdominal pain, Shortness of breath, swallowing difficulties, respiratory complaints.  No other known allergies. Nothing tried to improve his symptoms.  Past Medical History:  Diagnosis Date  . Bipolar 1 disorder (HCC)    per spouse - diagnosed as a teenager   Patient Active Problem List   Diagnosis Date Noted  . 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 Psychoactive substance-induced mood disorder (HCC) 05/28/2016   History reviewed. No pertinent surgical history.  Home Medications    Prior to Admission medications   Medication Sig Start Date End Date Taking? Authorizing Provider  gabapentin (NEURONTIN) 300 MG capsule Take 1 capsule (300 mg total) by mouth 3 (three) times daily. For agitation 06/02/16   Sanjuana Kava, NP  hydrOXYzine (ATARAX/VISTARIL) 25 MG tablet Take 1 tablet (25 mg total) by mouth 3 (three) times daily as needed for anxiety. 06/02/16   Sanjuana Kava, NP  nicotine (NICODERM CQ - DOSED  IN MG/24 HOURS) 21 mg/24hr patch Place 1 patch (21 mg total) onto the skin daily. For smoking cessation 06/03/16   Sanjuana Kava, NP  predniSONE (DELTASONE) 20 MG tablet Take 2 tablets (40 mg total) by mouth daily. 07/17/16   Joycie Peek, PA-C  risperiDONE (RISPERDAL) 3 MG tablet Take 1 tablet (3 mg total) by mouth at bedtime. For mood control 06/02/16   Sanjuana Kava, NP  traZODone (DESYREL) 50 MG tablet Take 1 tablet (50 mg total) by mouth at bedtime as needed for sleep. 06/02/16   Sanjuana Kava, NP  triamcinolone cream (KENALOG) 0.1 % Apply 1 application topically 2 (two) times daily. 07/17/16   Joycie Peek, PA-C    Family History History reviewed. No pertinent family history.  Social History Social History  Substance Use Topics  . Smoking status: Current Every Day Smoker  . Smokeless tobacco: Never Used  . Alcohol use No   Allergies   Patient has no known allergies.  Review of Systems Review of Systems  Gastrointestinal: Negative for abdominal pain, nausea and vomiting.  Skin: Positive for rash.   Physical Exam Updated Vital Signs BP 117/73 (BP Location: Right Arm)   Pulse 97   Temp 98 F (36.7 C) (Oral)   Resp 18   SpO2 100%   Physical Exam  Constitutional: He is oriented to person, place, and time. He appears well-developed and well-nourished. No distress.  Awake, alert and nontoxic in appearance  HENT:  Head: Normocephalic and atraumatic.  Right Ear: External ear normal.  Left Ear: External  ear normal.  Mouth/Throat: Oropharynx is clear and moist.  Eyes: Conjunctivae and EOM are normal. Pupils are equal, round, and reactive to light.  Neck: Normal range of motion. No JVD present.  Cardiovascular: Normal rate, regular rhythm and normal heart sounds.   Pulmonary/Chest: Effort normal and breath sounds normal. No stridor.  Lungs clear to auscultation bilaterally   Abdominal: Soft. He exhibits no distension. There is no tenderness.  Musculoskeletal: Normal range of  motion.  Neurological: He is alert and oriented to person, place, and time. No cranial nerve deficit. He exhibits normal muscle tone. Coordination normal.  Skin: No rash noted. He is not diaphoretic.  Diffuse erythematous macular papular lesions roughtly 1-5 mm in size over trunk and extremities, associated excoriations, no secondary cellulitis.   Psychiatric: He has a normal mood and affect. His behavior is normal. Thought content normal.  Nursing note and vitals reviewed.  ED Treatments / Results  DIAGNOSTIC STUDIES:  Oxygen Saturation is 100% on RA, normal by my interpretation.    COORDINATION OF CARE:  3:18 PM Discussed treatment plan with pt at bedside and pt agreed to plan.  Labs (all labs ordered are listed, but only abnormal results are displayed) Labs Reviewed - No data to display  EKG  EKG Interpretation None      Radiology No results found.  Procedures Procedures (including critical care time)  Medications Ordered in ED Medications - No data to display  Initial Impression / Assessment and Plan / ED Course  I have reviewed the triage vital signs and the nursing notes.  Pertinent labs & imaging results that were available during my care of the patient were reviewed by me and considered in my medical decision making (see chart for details).     Patient with likely contact dermatitis. Instructed to avoid offending agent and to use unscented soaps, lotions, and detergents. Will treat with Prednisone  x 5day, triamcinolone.  Also discussed combination Benadryl and Zantac to help with itching. No signs of secondary infection. Follow up with PCP, Dermatology in 2-3 days. Return precautions discussed. Pt is safe for discharge at this time.  I personally performed the services described in this documentation, which was scribed in my presence. The recorded information has been reviewed and is accurate.    Final Clinical Impressions(s) / ED Diagnoses   Final  diagnoses:  Rash    New Prescriptions New Prescriptions   PREDNISONE (DELTASONE) 20 MG TABLET    Take 2 tablets (40 mg total) by mouth daily.   TRIAMCINOLONE CREAM (KENALOG) 0.1 %    Apply 1 application topically 2 (two) times daily.     Joycie Peek, PA-C 07/17/16 1535    Heide Scales, MD 07/17/16 1537

## 2016-07-17 NOTE — Discharge Instructions (Signed)
Please take your medications as prescribed and as we discussed. Use your triamcinolone sparingly and do not use on your face. You may use Benadryl and Zantac together as we discussed to help with itching. Avoid irritating lotions or laundry detergents. Follow-up with your doctor. Return to ED for any new or worsening symptoms as we discussed

## 2017-07-06 ENCOUNTER — Emergency Department (HOSPITAL_COMMUNITY)
Admission: EM | Admit: 2017-07-06 | Discharge: 2017-07-07 | Disposition: A | Discharge status: Home / Self Care | Payer: Self-pay | Attending: Emergency Medicine | Admitting: Emergency Medicine

## 2017-07-06 ENCOUNTER — Other Ambulatory Visit: Payer: Self-pay

## 2017-07-06 ENCOUNTER — Encounter (HOSPITAL_COMMUNITY): Payer: Self-pay

## 2017-07-06 DIAGNOSIS — F1020 Alcohol dependence, uncomplicated: Secondary | ICD-10-CM | POA: Insufficient documentation

## 2017-07-06 DIAGNOSIS — R45851 Suicidal ideations: Secondary | ICD-10-CM

## 2017-07-06 DIAGNOSIS — F19950 Other psychoactive substance use, unspecified with psychoactive substance-induced psychotic disorder with delusions: Secondary | ICD-10-CM | POA: Insufficient documentation

## 2017-07-06 DIAGNOSIS — F17200 Nicotine dependence, unspecified, uncomplicated: Secondary | ICD-10-CM | POA: Insufficient documentation

## 2017-07-06 NOTE — ED Triage Notes (Signed)
States stopped taking bipolar medications a long time ago and called Sheriffs Dept today states he was going to jump off bridge.

## 2017-07-06 NOTE — ED Notes (Signed)
Bed: [facility]4 Expected date:  Expected time:  Means of arrival:  Comments: 

## 2017-07-07 LAB — RAPID URINE DRUG SCREEN, HOSP PERFORMED
AMPHETAMINES: NOT DETECTED
BARBITURATES: NOT DETECTED
BENZODIAZEPINES: NOT DETECTED
Cocaine: NOT DETECTED
Opiates: NOT DETECTED
Tetrahydrocannabinol: NOT DETECTED

## 2017-07-07 LAB — COMPREHENSIVE METABOLIC PANEL
ALT: 19 U/L (ref 17–63)
AST: 20 U/L (ref 15–41)
Albumin: 4 g/dL (ref 3.5–5.0)
Alkaline Phosphatase: 58 U/L (ref 38–126)
Anion gap: 10 (ref 5–15)
BUN: 8 mg/dL (ref 6–20)
CHLORIDE: 111 mmol/L (ref 101–111)
CO2: 21 mmol/L — AB (ref 22–32)
Calcium: 9 mg/dL (ref 8.9–10.3)
Creatinine, Ser: 0.83 mg/dL (ref 0.61–1.24)
Glucose, Bld: 94 mg/dL (ref 65–99)
POTASSIUM: 3.8 mmol/L (ref 3.5–5.1)
SODIUM: 142 mmol/L (ref 135–145)
Total Bilirubin: 0.3 mg/dL (ref 0.3–1.2)
Total Protein: 6.9 g/dL (ref 6.5–8.1)

## 2017-07-07 LAB — CBC
HEMATOCRIT: 42 % (ref 39.0–52.0)
Hemoglobin: 14 g/dL (ref 13.0–17.0)
MCH: 29.2 pg (ref 26.0–34.0)
MCHC: 33.3 g/dL (ref 30.0–36.0)
MCV: 87.7 fL (ref 78.0–100.0)
Platelets: 248 10*3/uL (ref 150–400)
RBC: 4.79 MIL/uL (ref 4.22–5.81)
RDW: 13.7 % (ref 11.5–15.5)
WBC: 7.6 10*3/uL (ref 4.0–10.5)

## 2017-07-07 MED ORDER — LORAZEPAM 1 MG PO TABS
1.0000 mg | ORAL_TABLET | ORAL | Status: AC | PRN
  Administered 2017-07-07: 1 mg via ORAL
  Filled 2017-07-07: qty 1

## 2017-07-07 MED ORDER — ZIPRASIDONE MESYLATE 20 MG IM SOLR: 20.0000 mg | INTRAMUSCULAR | Status: DC | PRN

## 2017-07-07 MED ORDER — OLANZAPINE 10 MG PO TBDP: 10.0000 mg | ORAL_TABLET | Freq: Three times a day (TID) | ORAL | Status: DC | PRN

## 2017-07-07 MED ORDER — IBUPROFEN 200 MG PO TABS
600.0000 mg | ORAL_TABLET | Freq: Four times a day (QID) | ORAL | Status: DC | PRN
  Administered 2017-07-07: 600 mg via ORAL
  Filled 2017-07-07: qty 3

## 2017-07-07 NOTE — BH Assessment (Signed)
[facility]  Per Juanetta BeetsJacqueline Norman, DO, this pt does not require psychiatric hospitalization at this time.  Pt presents under IVC initiated by EDP Dutch Quinthris Pollina, MD, which Dr Sharma CovertNorman has rescinded.  [facility]  No outpatient referrals are necessary.  Pt's nurse, Donnal DebarRandi, has been notified.  Doylene Canninghomas Wyley Hack, MA Triage Specialist [phone]

## 2017-07-07 NOTE — ED Notes (Signed)
No respiratory or acute distress noted resting in bed with eyes closed call light in reach. 

## 2017-07-07 NOTE — BHH Suicide Risk Assessment (Signed)
Suicide Risk Assessment  Discharge Assessment   [facility]   Principal Problem: Substance-induced psychotic disorder with delusions [facility](HCC) Discharge Diagnoses:  Patient Active Problem List   Diagnosis Date Noted  . 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 Psychoactive substance-induced mood disorder (HCC) [U98.11[F19.94, F06.30] 05/28/2016   Pt was seen and chart reviewed with treatment team and Dr Sharma CovertNorman. Pt denies suicidal/homicidal ideation, denies auditory/visual hallucinations and does not appear to be responding to internal stimuli. Pt stated he was arguing with his wife and drinking and told the sheriff's deputy he was going to jump off a bridge. Pt denied intent and stated he said that because he didn't want to go back home and argue anymore. Pt stated he works and has four children ages 4 months to 12 years. Pt stated he needs to go to work today and feels safe to leave. Pt declined outpatient resources for substance abuse and stated he does not have a problem with alcohol Pt is psychiatrically clear for discharge.   Total Time spent with patient: 30 minutes  Musculoskeletal: Strength & Muscle Tone: within normal limits Gait & Station: normal Patient leans: N/A  Psychiatric Specialty Exam:   Blood pressure 128/78, pulse 75, temperature 97.7 F (36.5 C), temperature source Oral, resp. rate 18, height 5\' 10"  (1.778 m), weight 81.6 kg (180 lb), SpO2 96 %.Body mass index is 25.83 kg/m.  General Appearance: Casual  Eye Contact::  Fair  Speech:  Clear and Coherent and Normal Rate409  Volume:  Normal  Mood:  Irritable  Affect:  Congruent  Thought Process:  Coherent, Goal Directed and Linear  Orientation:  Full (Time, Place, and Person)  Thought Content:  Logical  Suicidal Thoughts:  No  Homicidal Thoughts:  No  Memory:  Immediate;   Good Recent;   Good Remote;   Fair  Judgement:  Poor  Insight:  Lacking   Psychomotor Activity:  Normal  Concentration:  Good  Recall:  Good  Fund of Knowledge:Good  Language: Good  Akathisia:  No  Handed:  Right  AIMS (if indicated):     Assets:  Communication Skills  Sleep:     Cognition: WNL  ADL's:  Intact   Mental Status Per Nursing Assessment::   On Admission:   Intoxicated  Demographic Factors:  Male and Caucasian  Loss Factors: Legal issues and Financial problems/change in socioeconomic status  Historical Factors: Impulsivity  Risk Reduction Factors:   Responsible for children under 33 years of age, Sense of responsibility to family and Living with another person, especially a relative  Continued Clinical Symptoms:  Alcohol/Substance Abuse/Dependencies  Cognitive Features That Contribute To Risk:  Closed-mindedness    Suicide Risk:  Minimal: No identifiable suicidal ideation.  Patients presenting with no risk factors but with morbid ruminations; may be classified as minimal risk based on the severity of the depressive symptoms    Plan Of Care/Follow-up recommendations:  Activity:  as tolerated Diet:  Heart healthy  Laveda AbbeLaurie Britton Parks, NP 07/07/2017, 11:57 AM

## 2017-07-07 NOTE — ED Notes (Signed)
Charge nurse aware that pt need hospital sitter pt is IVC'd

## 2017-07-07 NOTE — ED Notes (Addendum)
[phone]

## 2017-07-07 NOTE — ED Notes (Signed)
Bed: WA27 Expected date:  Expected time:  Means of arrival:  Comments: 
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
## 2018-10-15 IMAGING — DX DG HAND COMPLETE 3+V*R*
3 series · 3 of 3 positions shown · non-contrast
Comparison: None.

CLINICAL DATA: Acute onset of right hand bruising and abrasions.
Initial encounter.

EXAM:
RIGHT HAND - COMPLETE 3+ VIEW

[hand pa]
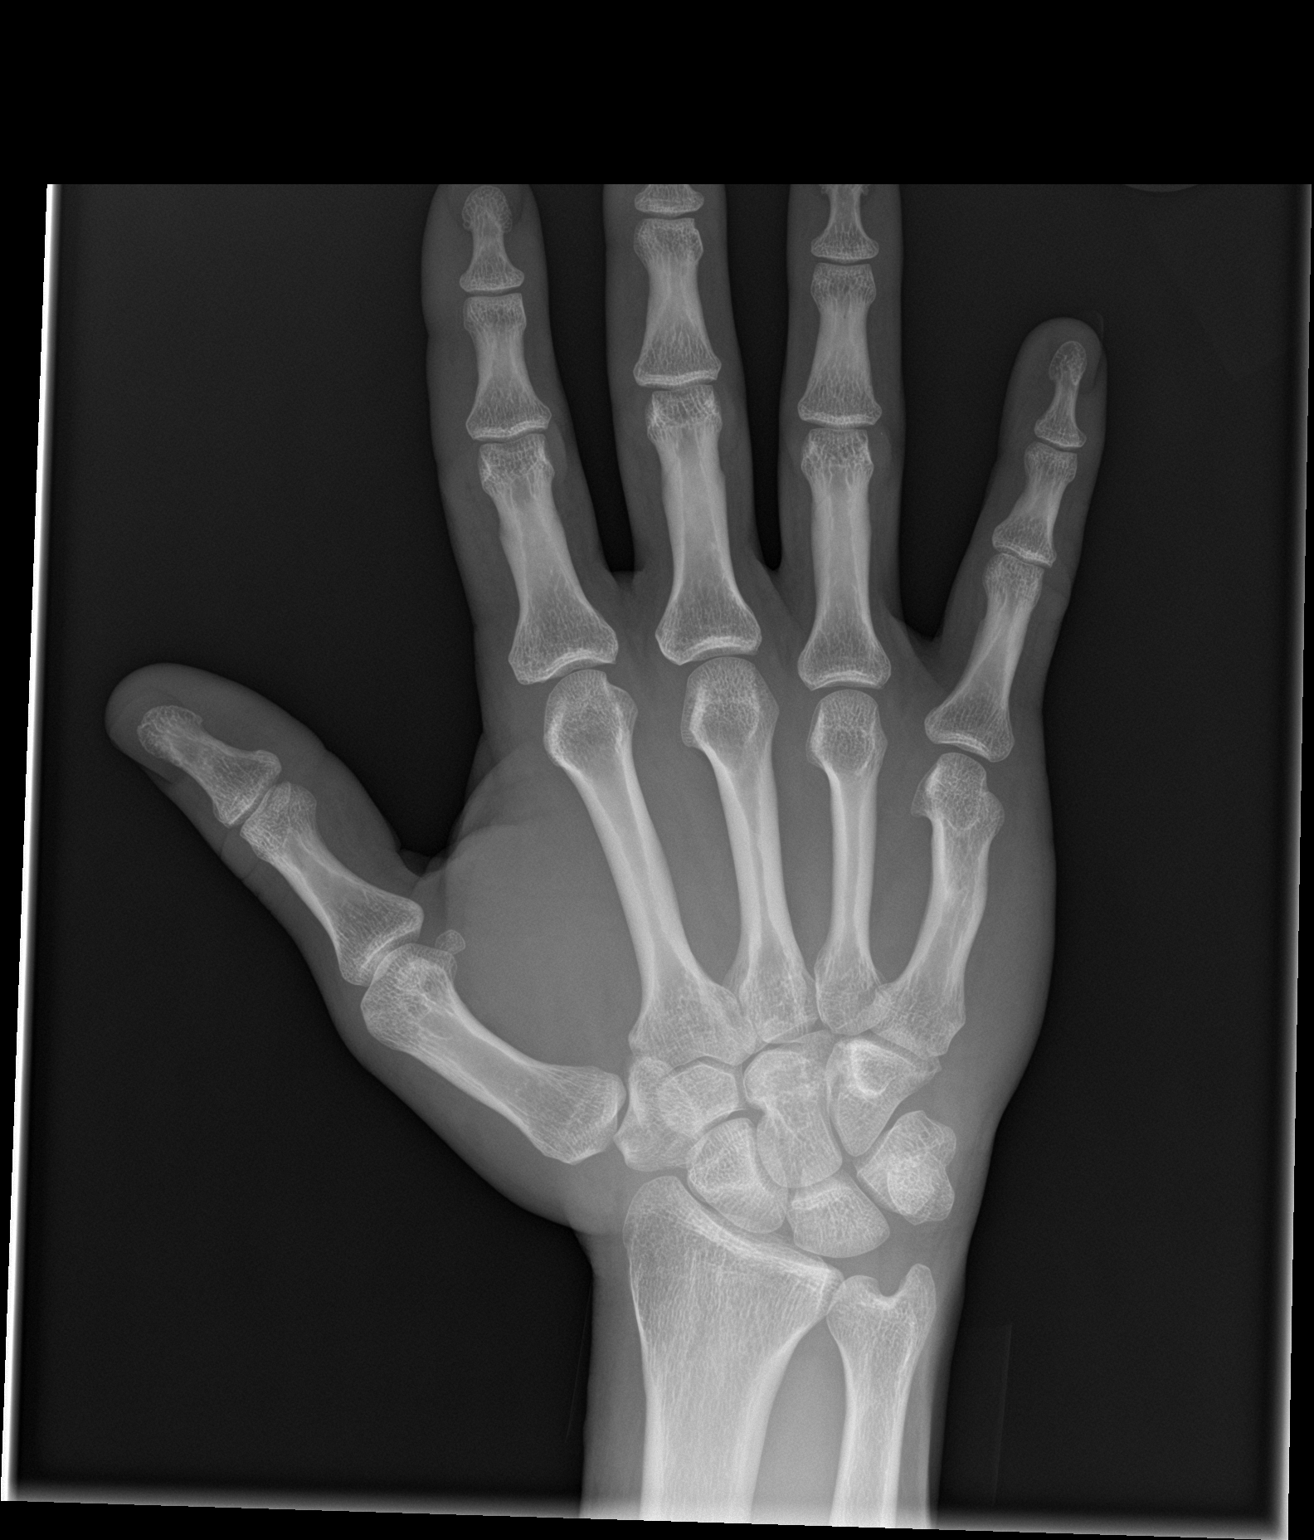

[hand obl]
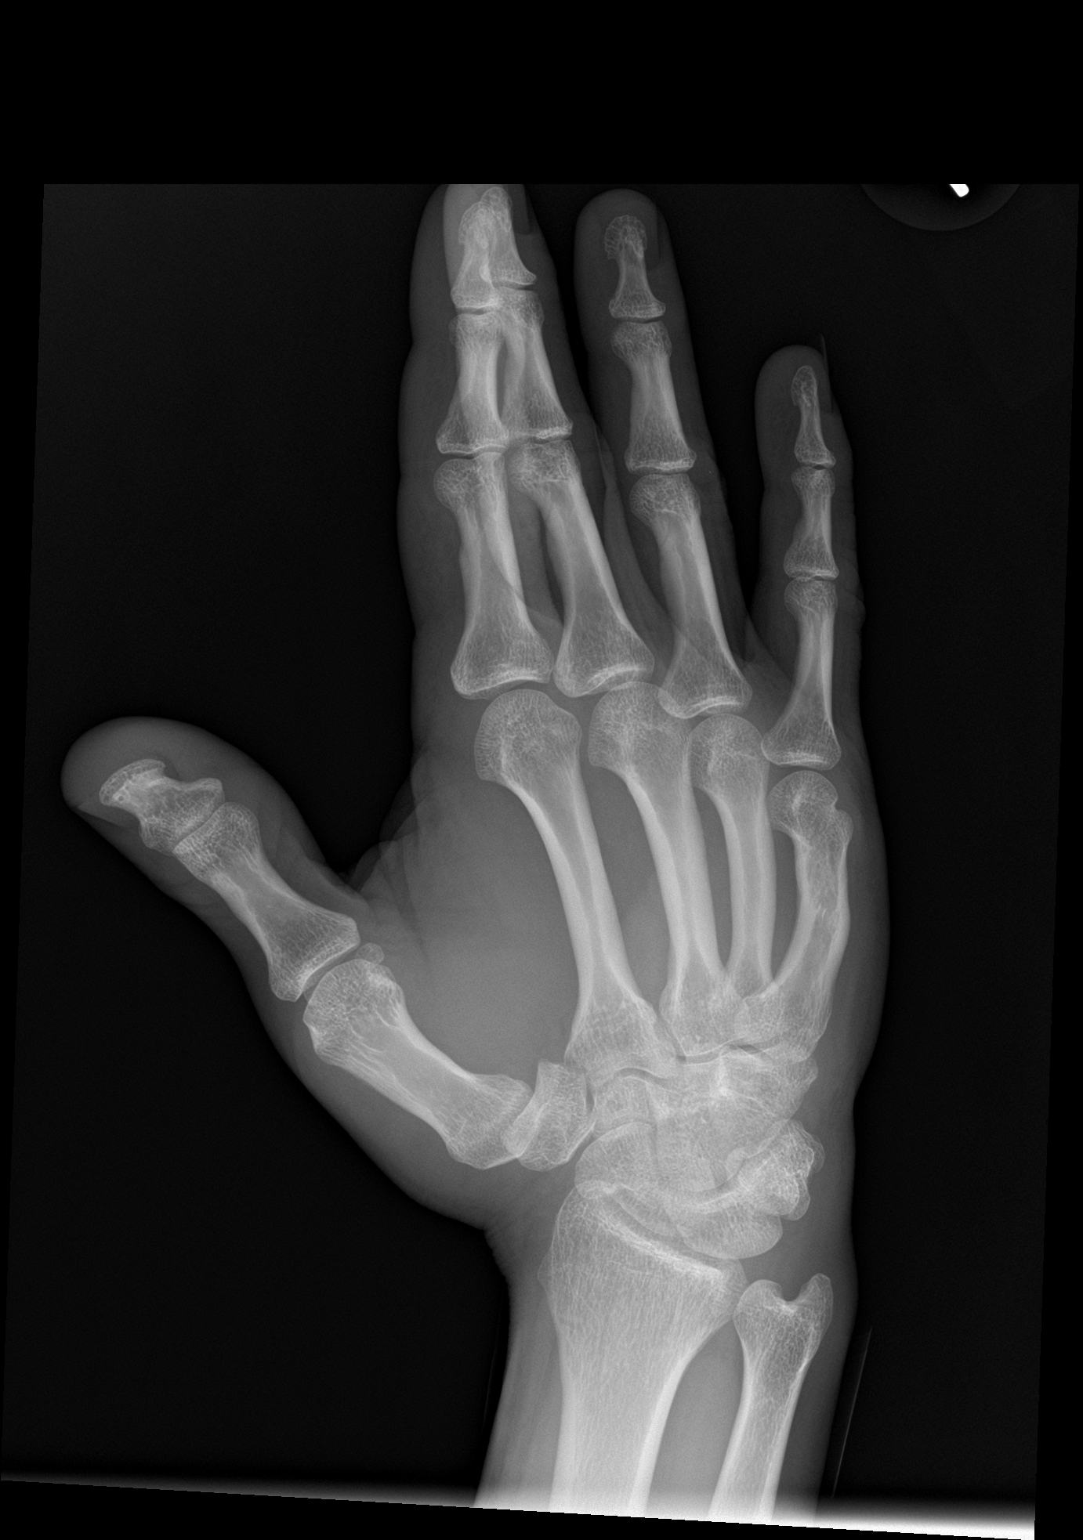

[hand lat]
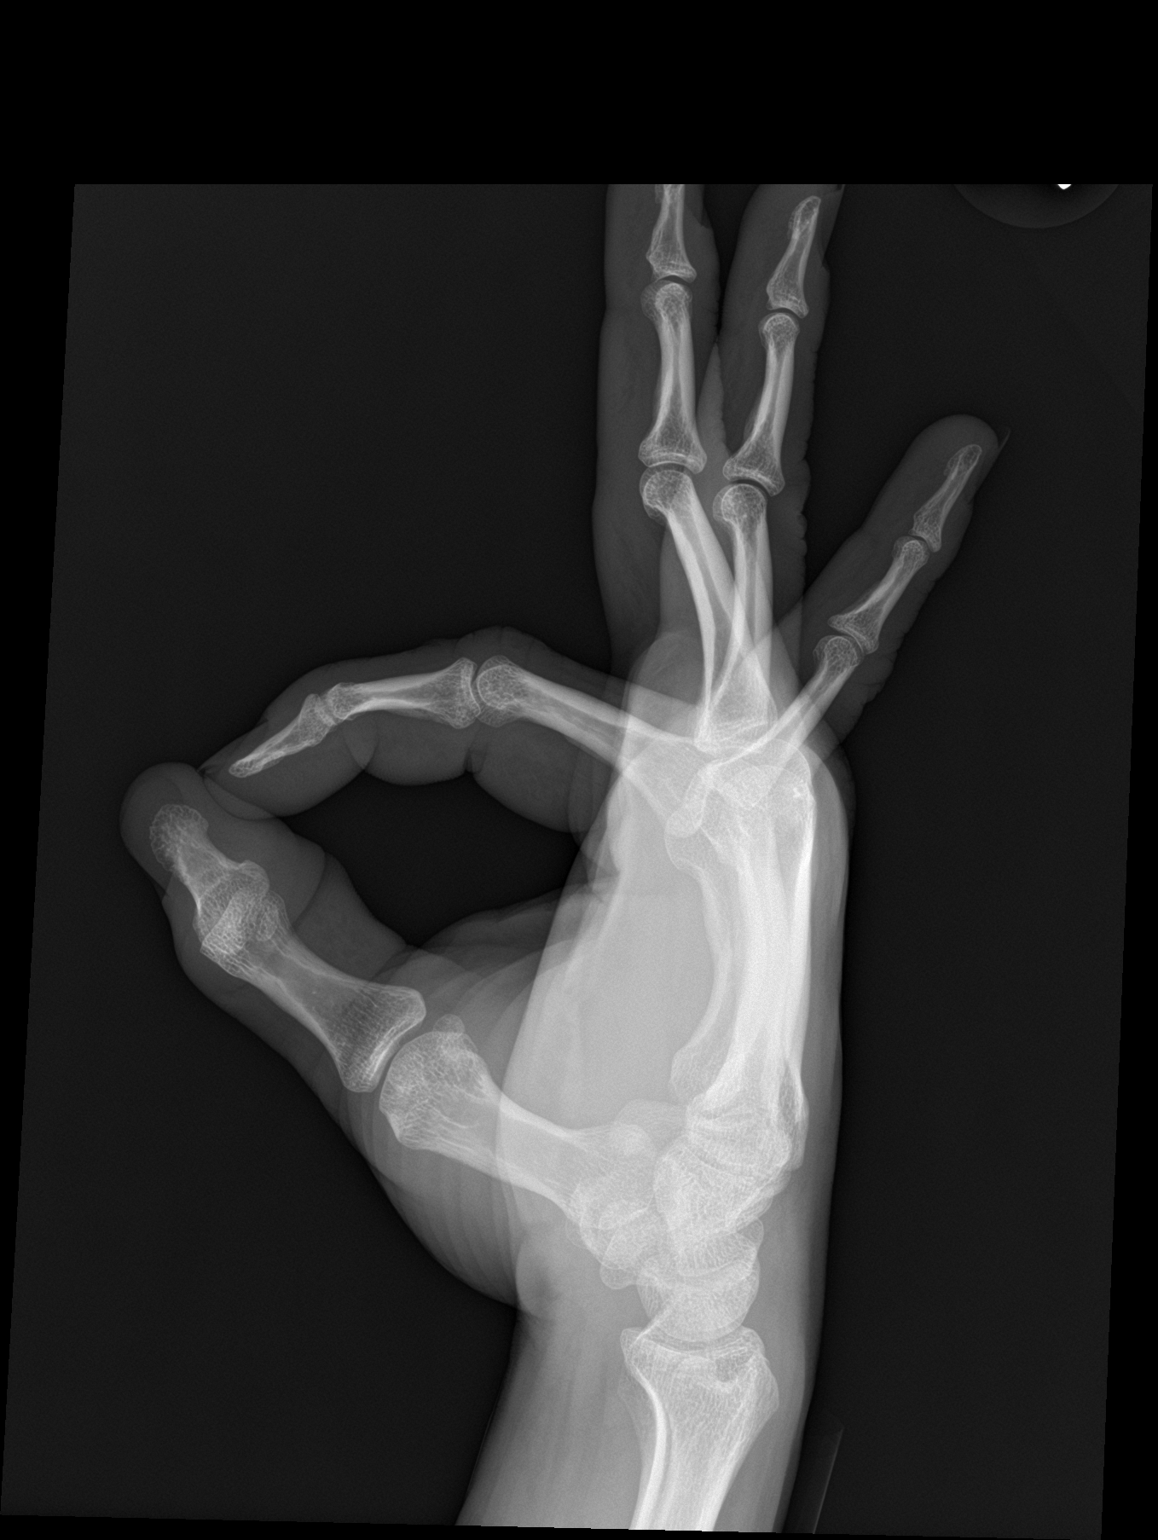

[3 of 3 positions shown; findings below may reference images not displayed]

FINDINGS: There is no evidence of fracture or dislocation. There is mild
chronic deformity of the fifth metacarpal. The joint spaces are
preserved. The carpal rows are intact, and demonstrate normal
alignment. Known soft tissue abrasions are not well characterized on
radiograph.
IMPRESSION: No evidence of fracture or dislocation.

## 2021-01-27 ENCOUNTER — Other Ambulatory Visit: Payer: Self-pay

## 2021-01-27 ENCOUNTER — Emergency Department (HOSPITAL_COMMUNITY)
Admission: EM | Admit: 2021-01-27 | Discharge: 2021-01-27 | Disposition: A | Discharge status: Home / Self Care | Payer: Self-pay | Attending: Emergency Medicine | Admitting: Emergency Medicine

## 2021-01-27 ENCOUNTER — Encounter (HOSPITAL_COMMUNITY): Payer: Self-pay | Admitting: Emergency Medicine

## 2021-01-27 DIAGNOSIS — S41111A Laceration without foreign body of right upper arm, initial encounter: Secondary | ICD-10-CM

## 2021-01-27 DIAGNOSIS — Y92009 Unspecified place in unspecified non-institutional (private) residence as the place of occurrence of the external cause: Secondary | ICD-10-CM | POA: Insufficient documentation

## 2021-01-27 DIAGNOSIS — S51811A Laceration without foreign body of right forearm, initial encounter: Secondary | ICD-10-CM | POA: Insufficient documentation

## 2021-01-27 DIAGNOSIS — F17200 Nicotine dependence, unspecified, uncomplicated: Secondary | ICD-10-CM | POA: Insufficient documentation

## 2021-01-27 DIAGNOSIS — W25XXXA Contact with sharp glass, initial encounter: Secondary | ICD-10-CM | POA: Insufficient documentation
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
## 2021-01-27 NOTE — Discharge Instructions (Addendum)
You were seen in the ER today with your laceration.  Your laceration was repaired using Steri-Strips.  These will fall off on their own in 10 to 14 days.  Please seek medical attention if you begin to develop any redness, swelling around your wounds, puslike drainage from your wounds, redness streaking up your arm towards your chest or down into your hand, fevers, chills, as these could be signs of infection.    Please keep the wound clean and return to the ER with any new severe symptoms.

## 2021-01-27 NOTE — ED Provider Notes (Signed)
[facility]-EMERGENCY DEPT Provider Note   CSN: [id number] Arrival date & time: 01/27/21  1125     History Chief Complaint  Patient presents with  . Laceration  . Medical Clearance    Brad Saunders is a 35 y.o. male who presents with concern for laceration to the right forearm.  Presents in police custody.  According to police officer patient punched his hand through a window in his own home.  Unfortunate was called to the scene note is unclear why.  Law enforcement unable to provide any further insight into the situation and the patient is unwilling to talk with me.  Patient will not look me in the face and does not answer any questions.  I have personally reviewed this patient's medical records.  He has history of psychoactive substance induced mood disorder and bipolar 1.  HPI     Past Medical History:  Diagnosis Date  . Bipolar 1 disorder (HCC)    per spouse - diagnosed as a teenager    Patient Active Problem List   Diagnosis Date Noted  . 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 Psychoactive substance-induced mood disorder (HCC) 05/28/2016    History reviewed. No pertinent surgical history.     History reviewed. No pertinent family history.  Social History   Tobacco Use  . Smoking status: Every Day  . Smokeless tobacco: Never  Substance Use Topics  . Alcohol use: No    Home Medications Prior to Admission medications   Medication Sig Start Date End Date Taking? Authorizing Provider  gabapentin (NEURONTIN) 300 MG capsule Take 1 capsule (300 mg total) by mouth 3 (three) times daily. For agitation Patient not taking: Reported on 07/06/2017 06/02/16   Armandina Stammer I, NP  hydrOXYzine (ATARAX/VISTARIL) 25 MG tablet Take 1 tablet (25 mg total) by mouth 3 (three) times daily as needed for anxiety. Patient not taking: Reported on 07/06/2017 06/02/16   Armandina Stammer I, NP  nicotine (NICODERM CQ - DOSED IN MG/24 HOURS) 21  mg/24hr patch Place 1 patch (21 mg total) onto the skin daily. For smoking cessation Patient not taking: Reported on 07/06/2017 06/03/16   Armandina Stammer I, NP  predniSONE (DELTASONE) 20 MG tablet Take 2 tablets (40 mg total) by mouth daily. Patient not taking: Reported on 07/06/2017 07/17/16   Joycie Peek, PA-C  risperiDONE (RISPERDAL) 3 MG tablet Take 1 tablet (3 mg total) by mouth at bedtime. For mood control Patient not taking: Reported on 07/06/2017 06/02/16   Armandina Stammer I, NP  traZODone (DESYREL) 50 MG tablet Take 1 tablet (50 mg total) by mouth at bedtime as needed for sleep. Patient not taking: Reported on 07/06/2017 06/02/16   Armandina Stammer I, NP  triamcinolone cream (KENALOG) 0.1 % Apply 1 application topically 2 (two) times daily. Patient not taking: Reported on 07/06/2017 07/17/16   Joycie Peek, PA-C    Allergies    Patient has no known allergies.  Review of Systems   Review of Systems  Unable to perform ROS: Other (Patient unwilling to answer any questions)  Skin:  Positive for wound.   Physical Exam Updated Vital Signs BP 109/87 (BP Location: Right Arm)   Pulse 92   Temp 98.5 F (36.9 C) (Oral)   Resp 16   Ht 5\' 11"  (1.803 m)   Wt 90.7 kg   SpO2 100%   BMI 27.89 kg/m   Physical Exam Vitals and nursing note reviewed.  Constitutional:      Appearance:  He is not ill-appearing or toxic-appearing.  HENT:     Head: Normocephalic and atraumatic.     Nose: Nose normal. No congestion.     Mouth/Throat:     Mouth: Mucous membranes are moist.     Pharynx: No oropharyngeal exudate or posterior oropharyngeal erythema.  Eyes:     General:        Right eye: No discharge.        Left eye: No discharge.     Conjunctiva/sclera: Conjunctivae normal.     Pupils: Pupils are equal, round, and reactive to light.  Cardiovascular:     Rate and Rhythm: Normal rate and regular rhythm.     Pulses: Normal pulses.     Heart sounds: Normal heart sounds.  Pulmonary:     Effort: Pulmonary  effort is normal. No respiratory distress.     Breath sounds: Normal breath sounds. No wheezing or rales.  Abdominal:     General: Bowel sounds are normal. There is no distension.     Palpations: Abdomen is soft.     Tenderness: There is no abdominal tenderness. There is no guarding or rebound.  Musculoskeletal:        General: No deformity.       Arms:     Cervical back: Neck supple.     Comments: Abrasions over the dorsum of the right forearm.   Skin:    General: Skin is warm and dry.     Capillary Refill: Capillary refill takes less than 2 seconds.     Findings: Abrasion, signs of injury and laceration present.  Neurological:     General: No focal deficit present.     Mental Status: He is alert and oriented to person, place, and time. Mental status is at baseline.  Psychiatric:        Mood and Affect: Mood normal.    ED Results / Procedures / Treatments   Labs (all labs ordered are listed, but only abnormal results are displayed) Labs Reviewed - No data to display  EKG None  Radiology No results found.  Procedures .Marland KitchenLaceration Repair  Date/Time: 01/27/2021 3:54 PM Performed by: Paris Lore, PA-C Authorized by: Paris Lore, PA-C   Consent:    Consent obtained:  Verbal   Consent given by:  Patient   Risks discussed:  Infection, need for additional repair, pain, poor cosmetic result and poor wound healing   Alternatives discussed:  No treatment and delayed treatment Universal protocol:    Procedure explained and questions answered to patient or proxy's satisfaction: yes     Relevant documents present and verified: yes     Test results available: yes     Imaging studies available: yes     Required blood products, implants, devices, and special equipment available: yes     Site/side marked: yes     Immediately prior to procedure, a time out was called: yes     Patient identity confirmed:  Verbally with patient Anesthesia:    Anesthesia  method:  Local infiltration Laceration details:    Location:  Shoulder/arm   Shoulder/arm location:  R lower arm   Length (cm):  1.5 (superficial) Exploration:    Limited defect created (wound extended): no     Hemostasis achieved with:  Direct pressure   Wound exploration: wound explored through full range of motion     Wound extent: no foreign bodies/material noted, no tendon damage noted and no underlying fracture noted     Contaminated:  no   Treatment:    Area cleansed with:  Saline   Amount of cleaning:  Extensive   Irrigation solution:  Sterile saline   Irrigation method:  Pressure wash   Visualized foreign bodies/material removed: no   Skin repair:    Repair method:  Steri-Strips   Number of Steri-Strips:  4 Approximation:    Approximation:  Close Repair type:    Repair type:  Simple Post-procedure details:    Dressing:  Non-adherent dressing   Procedure completion:  Tolerated well, no immediate complications Comments:     Steri strips applied by PA student, Janae Bridgeman, PA-C with my direct supervision. Wound oozing small amount of blood after irrigation. Hemostatic after application of pressure with [place]. Patient tolerated the procedure well.    Medications Ordered in ED Medications - No data to display  ED Course  I have reviewed the triage vital signs and the nursing notes.  Pertinent labs & imaging results that were available during my care of the patient were reviewed by me and considered in my medical decision making (see chart for details).    MDM Rules/Calculators/A&P                         35 year old male presents with laceration to the dorsum of the right arm after punching through a glass window.  Per chart review patient did receive tetanus vaccine in 2014.  Patient would not give permission for tetanus vaccination today and is within the 10-year window.  Vital signs are normal intake.  Cardiopulmonary exam is normal, abdominal exam is benign.   Patient is moving all 4 extremity spontaneously without difficulty.  Lacerations as above.  Patient alert and oriented, answering yes or no questions intermittently.  Angry affect.  Patient unwilling to answer any further questions.  Wounds were repaired as above.  Patient's vital signs are normal and his cardiopulmonary and abdominal exams are benign.  He appears neurologically and vascularly intact all 4 of his extremities.  No further work-up warranted in the ER at this time.  Brad Saunders was thoroughly explained his medical evaluation and treatment plan.  He did not ask any questions.  He is well-appearing, stable, and appropriate for discharge at this time.  Return precautions given.  This chart was dictated using voice recognition software, Dragon. Despite the best efforts of this provider to proofread and correct errors, errors may still occur which can change documentation meaning.   Final Clinical Impression(s) / ED Diagnoses Final diagnoses:  None    Rx / DC Orders ED Discharge Orders     None        Sherrilee Gilles 01/27/21 1556    Derwood Kaplan, MD 01/28/21 [phone]
